# Patient Record
Sex: Female | Born: 1985 | Race: White | Hispanic: No | Marital: Married | Smoking: Never smoker
Health system: Southern US, Community
[De-identification: ages and names within clinical notes are randomized; demographics above are authoritative.]

## PROBLEM LIST (undated history)

## (undated) DIAGNOSIS — Z789 Other specified health status: Secondary | ICD-10-CM

## (undated) HISTORY — PX: WISDOM TOOTH EXTRACTION: SHX21

---

## 2020-01-20 ENCOUNTER — Encounter: Payer: Self-pay | Admitting: Radiology

## 2020-01-25 ENCOUNTER — Other Ambulatory Visit (HOSPITAL_COMMUNITY)
Admission: RE | Admit: 2020-01-25 | Discharge: 2020-01-25 | Disposition: A | Discharge status: Home / Self Care | Payer: No Typology Code available for payment source | Source: Ambulatory Visit | Attending: Family Medicine | Admitting: Family Medicine

## 2020-01-25 ENCOUNTER — Ambulatory Visit (INDEPENDENT_AMBULATORY_CARE_PROVIDER_SITE_OTHER): Payer: No Typology Code available for payment source | Admitting: Obstetrics and Gynecology

## 2020-01-25 ENCOUNTER — Other Ambulatory Visit: Payer: Self-pay

## 2020-01-25 ENCOUNTER — Encounter: Payer: Self-pay | Admitting: Obstetrics and Gynecology

## 2020-01-25 ENCOUNTER — Encounter: Payer: Self-pay | Admitting: Family Medicine

## 2020-01-25 VITALS — BP 126/78 | HR 103 | Ht 65.5 in | Wt 140.0 lb

## 2020-01-25 DIAGNOSIS — Z3480 Encounter for supervision of other normal pregnancy, unspecified trimester: Secondary | ICD-10-CM

## 2020-01-25 DIAGNOSIS — N96 Recurrent pregnancy loss: Secondary | ICD-10-CM

## 2020-01-25 DIAGNOSIS — Z3A12 12 weeks gestation of pregnancy: Secondary | ICD-10-CM

## 2020-01-25 DIAGNOSIS — Z3481 Encounter for supervision of other normal pregnancy, first trimester: Secondary | ICD-10-CM

## 2020-01-25 NOTE — Progress Notes (Signed)
DATING AND VIABILITY SONOGRAM   Krista Russell is a 34 y.o. year old [phone] with LMP Patient's last menstrual period was 10/31/2019. which would correlate to  [redacted]w[redacted]d weeks gestation.  She has regular menstrual cycles.   She is here today for a confirmatory initial sonogram.    GESTATION: SINGLETON yes     FETAL ACTIVITY:          Heart rate     166          The fetus is active.   GESTATIONAL AGE AND  BIOMETRICS:  Gestational criteria: Estimated Date of Delivery: 08/06/20 by LMP now at [redacted]w[redacted]d  Previous Scans:0  GESTATIONAL SAC            mm          weeks  CROWN RUMP LENGTH           5.27 mm         12.0 weeks                                                   AVERAGE EGA(BY THIS SCAN):  12.0 weeks  WORKING EDD( LMP ):  08/06/2020     TECHNICIAN COMMENTS:   Patient informed that the ultrasound is considered a limited obstetric ultrasound and is not intended to be a complete ultrasound exam. Patient also informed that the ultrasound is not being completed with the intent of assessing for fetal or placental anomalies or any pelvic abnormalities. Explained that the purpose of today's ultrasound is to assess for fetal heart rate. Patient acknowledges the purpose of the exam and the limitations of the study.      A copy of this report including all images has been saved and backed up to a second source for retrieval if needed. All measures and details of the anatomical scan, placentation, fluid volume and pelvic anatomy are contained in that report.  Scheryl Marten 01/25/2020 2:42 PM

## 2020-01-25 NOTE — Progress Notes (Signed)
New OB Note  01/25/2020   Clinic: [facility] for [facility]  Chief Complaint: NOB  Transfer of Care Patient: no  History of Present Illness: Ms. Krista Russell is a 34 y.o. [id number] @ 12/2 weeks (EDC 3/7, based on Patient's last menstrual period was 10/31/2019.=12wk u/s.  Preg complicated by has Supervision of other normal pregnancy, antepartum and Recurrent pregnancy loss on their problem list.   Any events prior to today's visit: no Her periods were: qmonth, regular She was using no method when she conceived.  She has Negative signs or symptoms of nausea/vomiting of pregnancy. She has Negative signs or symptoms of miscarriage or preterm labor  ROS: A 12-point review of systems was performed and negative, except as stated in the above HPI.  OBGYN History: As per HPI. OB History  Gravida Para Term Preterm AB Living  6 1 1   4 1   SAB TAB Ectopic Multiple Live Births  4       1    # Outcome Date GA Lbr Len/2nd Weight Sex Delivery Anes PTL Lv  6 Current           5 SAB 05/2019          4 SAB 03/2019          3 SAB 10/2018          2 Term 02/09/16    F Vag-Forceps   LIV  1 SAB 2016            Obstetric Comments  All very early SABs that passed spontaneously.    Prior children are healthy, doing well, and without any problems or issues: yes History of pap smears: Yes. Last pap smear 2017 and results were NILM/HPV negative   Past Medical History: History reviewed. No pertinent past medical history.  Past Surgical History: Past Surgical History:  Procedure Laterality Date  . WISDOM TOOTH EXTRACTION      Family History:  History reviewed. No pertinent family history.  Social History:  Social History   Socioeconomic History  . Marital status: Married    Spouse name: Not on file  . Number of children: Not on file  . Years of education: Not on file  . Highest education level: Not on file  Occupational History  . Not on file  Tobacco Use  . Smoking status:  Never Smoker  . Smokeless tobacco: Never Used  Vaping Use  . Vaping Use: Never used  Substance and Sexual Activity  . Alcohol use: Not Currently  . Drug use: Not Currently  . Sexual activity: Yes    Birth control/protection: None  Other Topics Concern  . Not on file  Social History Narrative  . Not on file   Social Determinants of Health   Financial Resource Strain:   . Difficulty of Paying Living Expenses: Not on file  Food Insecurity:   . Worried About 2018 in the Last Year: Not on file  . Ran Out of Food in the Last Year: Not on file  Transportation Needs:   . Lack of Transportation (Medical): Not on file  . Lack of Transportation (Non-Medical): Not on file  Physical Activity:   . Days of Exercise per Week: Not on file  . Minutes of Exercise per Session: Not on file  Stress:   . Feeling of Stress : Not on file  Social Connections:   . Frequency of Communication with Friends and Family: Not on file  . Frequency of Social  Gatherings with Friends and Family: Not on file  . Attends Religious Services: Not on file  . Active Member of Clubs or Organizations: Not on file  . Attends Banker Meetings: Not on file  . Marital Status: Not on file  Intimate Partner Violence:   . Fear of Current or Ex-Partner: Not on file  . Emotionally Abused: Not on file  . Physically Abused: Not on file  . Sexually Abused: Not on file  Patient is a PA   Allergy: No Known Allergies  Current Outpatient Medications: Prenatal vitamin  Physical Exam:   BP 126/78   Pulse (!) 103   Ht 5' 5.5" (1.664 m)   Wt 140 lb (63.5 kg)   LMP 10/31/2019   BMI 22.94 kg/m  Body mass index is 22.94 kg/m. Contractions: Not present Vag. Bleeding: None. Fundal height: not applicable FHTs: 160s  General appearance: Well nourished, well developed female in no acute distress. Exam declined Respiratory:   Normal respiratory effort Neuro/Psych:  Normal mood and affect.  Skin:   Warm and dry.   Laboratory: None  Imaging:  Bedside u/s: SLIUP at 12/0 by CRL, FHR 160s, subjectively normal fluid.   Assessment: pt doing well  Plan: 1. Supervision of other normal pregnancy, antepartum Forceps delivery in the past due to what sounds like prolonged 2nd stage with mediolateral episiotomy. No sequelae.   Routine care. Declined genetics. Offer afp nv.  - CBC/D/Plt+RPR+Rh+ABO+Rub Ab... - Culture, OB Urine - GC/Chlamydia probe amp ([place])not at [facility] - VITAMIN D 25 Hydroxy (Vit-D Deficiency, Fractures) - [place] MFM OB COMP + 14 WK; Future  2. Recurrent pregnancy loss All early SABs that passed spontaneously. Husband in mid to late 30s. Per patient, she had lab work (APLA, ACM, etc) with prior OBGYN and negative.   Problem list reviewed and updated.  Follow up in 3 weeks.   >50% of 30 min visit spent on counseling and coordination of care.     Cornelia Copa MD Attending Center for [facility])

## 2020-01-25 NOTE — Patient Instructions (Signed)
March 7th is your Due Date Today you are 12 weeks and 2 days.

## 2020-01-26 LAB — CBC/D/PLT+RPR+RH+ABO+RUB AB...
Antibody Screen: NEGATIVE
Basophils Absolute: 0 10*3/uL (ref 0.0–0.2)
Basos: 0 %
EOS (ABSOLUTE): 0.1 10*3/uL (ref 0.0–0.4)
Eos: 1 %
HCV Ab: 0.1 s/co ratio (ref 0.0–0.9)
HIV Screen 4th Generation wRfx: NONREACTIVE
Hematocrit: 41.9 % (ref 34.0–46.6)
Hemoglobin: 13.6 g/dL (ref 11.1–15.9)
Hepatitis B Surface Ag: NEGATIVE
Immature Grans (Abs): 0.1 10*3/uL (ref 0.0–0.1)
Immature Granulocytes: 1 %
Lymphocytes Absolute: 1.9 10*3/uL (ref 0.7–3.1)
Lymphs: 18 %
MCH: 29.9 pg (ref 26.6–33.0)
MCHC: 32.5 g/dL (ref 31.5–35.7)
MCV: 92 fL (ref 79–97)
Monocytes Absolute: 0.7 10*3/uL (ref 0.1–0.9)
Monocytes: 7 %
Neutrophils Absolute: 7.6 10*3/uL — ABNORMAL HIGH (ref 1.4–7.0)
Neutrophils: 73 %
Platelets: 324 10*3/uL (ref 150–450)
RBC: 4.55 x10E6/uL (ref 3.77–5.28)
RDW: 13.1 % (ref 11.7–15.4)
RPR Ser Ql: NONREACTIVE
Rh Factor: POSITIVE
Rubella Antibodies, IGG: 2.21 index (ref >0.99)
WBC: 10.3 10*3/uL (ref 3.4–10.8)

## 2020-01-26 LAB — VITAMIN D 25 HYDROXY (VIT D DEFICIENCY, FRACTURES): Vit D, 25-Hydroxy: 21 ng/mL — ABNORMAL LOW (ref 30.0–100.0)

## 2020-01-26 LAB — HCV INTERPRETATION

## 2020-01-27 ENCOUNTER — Encounter: Payer: Self-pay | Admitting: Obstetrics and Gynecology

## 2020-01-27 DIAGNOSIS — R7989 Other specified abnormal findings of blood chemistry: Secondary | ICD-10-CM | POA: Insufficient documentation

## 2020-01-27 LAB — GC/CHLAMYDIA PROBE AMP (~~LOC~~) NOT AT ARMC
Chlamydia: NEGATIVE
Comment: NEGATIVE
Comment: NORMAL
Neisseria Gonorrhea: NEGATIVE

## 2020-01-28 LAB — URINE CULTURE, OB REFLEX

## 2020-01-28 LAB — CULTURE, OB URINE

## 2020-01-30 ENCOUNTER — Encounter: Payer: Self-pay | Admitting: Radiology

## 2020-02-21 ENCOUNTER — Ambulatory Visit (INDEPENDENT_AMBULATORY_CARE_PROVIDER_SITE_OTHER): Payer: No Typology Code available for payment source | Admitting: Family Medicine

## 2020-02-21 ENCOUNTER — Other Ambulatory Visit: Payer: Self-pay

## 2020-02-21 DIAGNOSIS — Z3480 Encounter for supervision of other normal pregnancy, unspecified trimester: Secondary | ICD-10-CM

## 2020-02-21 NOTE — Patient Instructions (Signed)

## 2020-02-21 NOTE — Progress Notes (Signed)
   PRENATAL VISIT NOTE  Subjective:  Krista Russell is a 34 y.o. [phone] at [redacted]w[redacted]d being seen today for ongoing prenatal care.  She is currently monitored for the following issues for this low-risk pregnancy and has Supervision of other normal pregnancy, antepartum; Recurrent pregnancy loss; and Low vitamin D level on their problem list.  Patient reports no complaints.  Contractions: Not present. Vag. Bleeding: None.   . Denies leaking of fluid.   The following portions of the patient's history were reviewed and updated as appropriate: allergies, current medications, past family history, past medical history, past social history, past surgical history and problem list.   Objective:   Vitals:   02/21/20 1054  BP: 121/83  Pulse: (!) 106  Weight: 141 lb (64 kg)    Fetal Status: Fetal Heart Rate (bpm): 165         General:  Alert, oriented and cooperative. Patient is in no acute distress.  Skin: Skin is warm and dry. No rash noted.   Cardiovascular: Normal heart rate noted  Respiratory: Normal respiratory effort, no problems with respiration noted  Abdomen: Soft, gravid, appropriate for gestational age.  Pain/Pressure: Absent     Pelvic: Cervical exam deferred        Extremities: Normal range of motion.  Edema: None  Mental Status: Normal mood and affect. Normal behavior. Normal judgment and thought content.   Assessment and Plan:  Pregnancy: [id number] at [redacted]w[redacted]d 1. Supervision of other normal pregnancy, antepartum Continue routine prenatal care. Declined AFP  Preterm labor symptoms and general obstetric precautions including but not limited to vaginal bleeding, contractions, leaking of fluid and fetal movement were reviewed in detail with the patient. Please refer to After Visit Summary for other counseling recommendations.   Return in 4 weeks (on 03/20/2020).  Future Appointments  Date Time Provider Department Center  03/08/2020  9:00 AM [facility]  03/20/2020  10:15 AM Reva Bores, MD [facility]    Reva Bores, MD

## 2020-03-08 ENCOUNTER — Ambulatory Visit: Payer: No Typology Code available for payment source | Attending: Obstetrics and Gynecology

## 2020-03-08 ENCOUNTER — Other Ambulatory Visit: Payer: Self-pay | Admitting: Obstetrics and Gynecology

## 2020-03-08 ENCOUNTER — Other Ambulatory Visit: Payer: Self-pay

## 2020-03-08 DIAGNOSIS — O358XX0 Maternal care for other (suspected) fetal abnormality and damage, not applicable or unspecified: Secondary | ICD-10-CM

## 2020-03-08 DIAGNOSIS — Z3480 Encounter for supervision of other normal pregnancy, unspecified trimester: Secondary | ICD-10-CM | POA: Diagnosis present

## 2020-03-08 DIAGNOSIS — Z3A18 18 weeks gestation of pregnancy: Secondary | ICD-10-CM

## 2020-03-12 ENCOUNTER — Encounter: Payer: Self-pay | Admitting: Obstetrics and Gynecology

## 2020-03-12 DIAGNOSIS — O283 Abnormal ultrasonic finding on antenatal screening of mother: Secondary | ICD-10-CM | POA: Insufficient documentation

## 2020-03-20 ENCOUNTER — Other Ambulatory Visit: Payer: Self-pay

## 2020-03-20 ENCOUNTER — Ambulatory Visit (INDEPENDENT_AMBULATORY_CARE_PROVIDER_SITE_OTHER): Payer: No Typology Code available for payment source | Admitting: Family Medicine

## 2020-03-20 VITALS — BP 121/84 | HR 103 | Wt 145.0 lb

## 2020-03-20 DIAGNOSIS — O283 Abnormal ultrasonic finding on antenatal screening of mother: Secondary | ICD-10-CM

## 2020-03-20 DIAGNOSIS — Z3480 Encounter for supervision of other normal pregnancy, unspecified trimester: Secondary | ICD-10-CM

## 2020-03-20 NOTE — Patient Instructions (Signed)

## 2020-03-20 NOTE — Progress Notes (Signed)
   PRENATAL VISIT NOTE  Subjective:  Krista Russell is a 34 y.o. [phone] at [redacted]w[redacted]d being seen today for ongoing prenatal care.  She is currently monitored for the following issues for this low-risk pregnancy and has Supervision of other normal pregnancy, antepartum; Recurrent pregnancy loss; Low vitamin D level; and Echogenic intracardiac focus of fetus on prenatal ultrasound on their problem list.  Patient reports no complaints.  Contractions: Not present. Vag. Bleeding: None.  Movement: Present. Denies leaking of fluid.   The following portions of the patient's history were reviewed and updated as appropriate: allergies, current medications, past family history, past medical history, past social history, past surgical history and problem list.   Objective:   Vitals:   03/20/20 1021  BP: 121/84  Pulse: (!) 103  Weight: 145 lb (65.8 kg)    Fetal Status: Fetal Heart Rate (bpm): 160   Movement: Present     General:  Alert, oriented and cooperative. Patient is in no acute distress.  Skin: Skin is warm and dry. No rash noted.   Cardiovascular: Normal heart rate noted  Respiratory: Normal respiratory effort, no problems with respiration noted  Abdomen: Soft, gravid, appropriate for gestational age.  Pain/Pressure: Present     Pelvic: Cervical exam deferred        Extremities: Normal range of motion.     Mental Status: Normal mood and affect. Normal behavior. Normal judgment and thought content.   Assessment and Plan:  Pregnancy: [id number] at [redacted]w[redacted]d 1. Supervision of other normal pregnancy, antepartum Aanatomy revealed EIF--reviewed risks for NIPT - Genetic Screening  2. Echogenic intracardiac focus of fetus on prenatal ultrasound   General obstetric precautions including but not limited to vaginal bleeding, contractions, leaking of fluid and fetal movement were reviewed in detail with the patient. Please refer to After Visit Summary for other counseling recommendations.   Return in 4  weeks (on 04/17/2020) for in person.  Future Appointments  Date Time Provider Department Center  04/25/2020  9:30 AM Calvert Cantor, CNM [facility]    Reva Bores, MD

## 2020-03-26 ENCOUNTER — Telehealth: Payer: Self-pay | Admitting: Radiology

## 2020-03-26 ENCOUNTER — Encounter: Payer: Self-pay | Admitting: Radiology

## 2020-03-26 NOTE — Telephone Encounter (Signed)
Patient was informed of Panorama results including fetal sex 

## 2020-04-25 ENCOUNTER — Other Ambulatory Visit: Payer: Self-pay

## 2020-04-25 ENCOUNTER — Ambulatory Visit (INDEPENDENT_AMBULATORY_CARE_PROVIDER_SITE_OTHER): Payer: No Typology Code available for payment source | Admitting: Advanced Practice Midwife

## 2020-04-25 VITALS — BP 117/79 | HR 114 | Wt 153.0 lb

## 2020-04-25 DIAGNOSIS — Z3480 Encounter for supervision of other normal pregnancy, unspecified trimester: Secondary | ICD-10-CM

## 2020-04-25 DIAGNOSIS — O283 Abnormal ultrasonic finding on antenatal screening of mother: Secondary | ICD-10-CM

## 2020-04-25 NOTE — Progress Notes (Signed)
   PRENATAL VISIT NOTE  Subjective:  Krista Russell is a 34 y.o. [phone] at [redacted]w[redacted]d being seen today for ongoing prenatal care.  She is currently monitored for the following issues for this low-risk pregnancy and has Supervision of other normal pregnancy, antepartum; Recurrent pregnancy loss; Low vitamin D level; and Echogenic intracardiac focus of fetus on prenatal ultrasound on their problem list.  Patient reports no complaints. Excited to be feeling so well.   Contractions: Not present. Vag. Bleeding: None.  Movement: Present. Denies leaking of fluid.   The following portions of the patient's history were reviewed and updated as appropriate: allergies, current medications, past family history, past medical history, past social history, past surgical history and problem list. Problem list updated.  Objective:   Vitals:   04/25/20 0932  BP: 117/79  Pulse: (!) 114  Weight: 153 lb (69.4 kg)    Fetal Status: Fetal Heart Rate (bpm): 148 Fundal Height: 25 cm Movement: Present     General:  Alert, oriented and cooperative. Patient is in no acute distress.  Skin: Skin is warm and dry. No rash noted.   Cardiovascular: Normal heart rate noted  Respiratory: Normal respiratory effort, no problems with respiration noted  Abdomen: Soft, gravid, appropriate for gestational age.  Pain/Pressure: Absent     Pelvic: Cervical exam deferred        Extremities: Normal range of motion.  Edema: None  Mental Status: Normal mood and affect. Normal behavior. Normal judgment and thought content.   Assessment and Plan:  Pregnancy: [id number] at [redacted]w[redacted]d  1. Supervision of other normal pregnancy, antepartum - GTT next visit. Discussed with APP team and Dr. Macon Large. Pt may substitute 20 oz Sprite for Glucola PRN  2. Echogenic intracardiac focus of fetus on prenatal ultrasound - Low risk Panorama, no further surveillance indicated at this time  - Declined Amniocentesis during MFM appt 03/08/2020  Preterm labor  symptoms and general obstetric precautions including but not limited to vaginal bleeding, contractions, leaking of fluid and fetal movement were reviewed in detail with the patient. Please refer to After Visit Summary for other counseling recommendations.    Future Appointments  Date Time Provider Department Center  05/23/2020  8:10 AM Calvert Cantor, CNM [facility]    Calvert Cantor, [place]

## 2020-05-23 ENCOUNTER — Other Ambulatory Visit: Payer: Self-pay

## 2020-05-23 ENCOUNTER — Encounter: Payer: Self-pay | Admitting: Advanced Practice Midwife

## 2020-05-23 ENCOUNTER — Ambulatory Visit (INDEPENDENT_AMBULATORY_CARE_PROVIDER_SITE_OTHER): Payer: No Typology Code available for payment source | Admitting: Advanced Practice Midwife

## 2020-05-23 VITALS — BP 111/74 | HR 105 | Wt 153.0 lb

## 2020-05-23 DIAGNOSIS — O09293 Supervision of pregnancy with other poor reproductive or obstetric history, third trimester: Secondary | ICD-10-CM | POA: Insufficient documentation

## 2020-05-23 DIAGNOSIS — Z3480 Encounter for supervision of other normal pregnancy, unspecified trimester: Secondary | ICD-10-CM

## 2020-05-23 DIAGNOSIS — Z3A29 29 weeks gestation of pregnancy: Secondary | ICD-10-CM

## 2020-05-23 NOTE — Progress Notes (Signed)
   PRENATAL VISIT NOTE  Subjective:  Krista Russell is a 34 y.o. [phone] at [redacted]w[redacted]d being seen today for ongoing prenatal care.  She is currently monitored for the following issues for this low-risk pregnancy and has Supervision of other normal pregnancy, antepartum; Recurrent pregnancy loss; Low vitamin D level; Echogenic intracardiac focus of fetus on prenatal ultrasound; and Hx of forceps delivery in prior pregnancy, currently pregnant, third trimester on their problem list.  Patient reports no complaints. Contractions: Not present. Vag. Bleeding: None.  Movement: Present. Denies leaking of fluid.   The following portions of the patient's history were reviewed and updated as appropriate: allergies, current medications, past family history, past medical history, past social history, past surgical history and problem list. Problem list updated.  Objective:   Vitals:   05/23/20 0824  BP: 111/74  Pulse: (!) 105  Weight: 153 lb (69.4 kg)    Fetal Status: Fetal Heart Rate (bpm): 147 Fundal Height: 28 cm Movement: Present     General:  Alert, oriented and cooperative. Patient is in no acute distress.  Skin: Skin is warm and dry. No rash noted.   Cardiovascular: Normal heart rate noted  Respiratory: Normal respiratory effort, no problems with respiration noted  Abdomen: Soft, gravid, appropriate for gestational age.  Pain/Pressure: Absent     Pelvic: Cervical exam deferred        Extremities: Normal range of motion.  Edema: None  Mental Status: Normal mood and affect. Normal behavior. Normal judgment and thought content.   Assessment and Plan:  Pregnancy: [id number] at [redacted]w[redacted]d  1. Supervision of other normal pregnancy, antepartum - LOB, routine care - Glucose tolerance, 1 hour - CBC  2. Hx Forceps -assisted Vaginal Delivery - Patient states baby was asynclitic, she pushed for > 4 hours, was advised to have forceps with experienced Provider - Discussed interventions in labor for improved  fetal positioning, typical ongoing assessment of progress with pushing, presence of very skilled nursing staff to provide support and repositioning with or without epidural - Advised consideration off doula for labor support. Given list  Preterm labor symptoms and general obstetric precautions including but not limited to vaginal bleeding, contractions, leaking of fluid and fetal movement were reviewed in detail with the patient. Please refer to After Visit Summary for other counseling recommendations.   Return in about 2 weeks (around 06/06/2020).  Future Appointments  Date Time Provider Department Center  06/06/2020 10:00 AM Calvert Cantor, [place] [facility]  06/20/2020  8:30 AM Calvert Cantor, CNM [facility]   Calvert Cantor, [place]

## 2020-05-23 NOTE — Patient Instructions (Addendum)
Third Trimester of Pregnancy  The third trimester is from week 28 through week 40 (months 7 through 9). This trimester is when your unborn baby (fetus) is growing very fast. At the end of the ninth month, the unborn baby is about 20 inches in length. It weighs about 6-10 pounds. Follow these instructions at home: Medicines  Take over-the-counter and prescription medicines only as told by your doctor. Some medicines are safe and some medicines are not safe during pregnancy.  Take a prenatal vitamin that contains at least 600 micrograms (mcg) of folic acid.  If you have trouble pooping (constipation), take medicine that will make your stool soft (stool softener) if your doctor approves. Eating and drinking   Eat regular, healthy meals.  Avoid raw meat and uncooked cheese.  If you get low calcium from the food you eat, talk to your doctor about taking a daily calcium supplement.  Eat four or five small meals rather than three large meals a day.  Avoid foods that are high in fat and sugars, such as fried and sweet foods.  To prevent constipation: ? Eat foods that are high in fiber, like fresh fruits and vegetables, whole grains, and beans. ? Drink enough fluids to keep your pee (urine) clear or pale yellow. Activity  Exercise only as told by your doctor. Stop exercising if you start to have cramps.  Avoid heavy lifting, wear low heels, and sit up straight.  Do not exercise if it is too hot, too humid, or if you are in a place of great height (high altitude).  You may continue to have sex unless your doctor tells you not to. Relieving pain and discomfort  Wear a good support bra if your breasts are tender.  Take frequent breaks and rest with your legs raised if you have leg cramps or low back pain.  Take warm water baths (sitz baths) to soothe pain or discomfort caused by hemorrhoids. Use hemorrhoid cream if your doctor approves.  If you develop puffy, bulging veins (varicose  veins) in your legs: ? Wear support hose or compression stockings as told by your doctor. ? Raise (elevate) your feet for 15 minutes, 3-4 times a day. ? Limit salt in your food. Safety  Wear your seat belt when driving.  Make a list of emergency phone numbers, including numbers for family, friends, the hospital, and police and fire departments. Preparing for your baby's arrival To prepare for the arrival of your baby:  Take prenatal classes.  Practice driving to the hospital.  Visit the hospital and tour the maternity area.  Talk to your work about taking leave once the baby comes.  Pack your hospital bag.  Prepare the baby's room.  Go to your doctor visits.  Buy a rear-facing car seat. Learn how to install it in your car. General instructions  Do not use hot tubs, steam rooms, or saunas.  Do not use any products that contain nicotine or tobacco, such as cigarettes and e-cigarettes. If you need help quitting, ask your doctor.  Do not drink alcohol.  Do not douche or use tampons or scented sanitary pads.  Do not cross your legs for long periods of time.  Do not travel for long distances unless you must. Only do so if your doctor says it is okay.  Visit your dentist if you have not gone during your pregnancy. Use a soft toothbrush to brush your teeth. Be gentle when you floss.  Avoid cat litter boxes and soil   used by cats. These carry germs that can cause birth defects in the baby and can cause a loss of your baby (miscarriage) or stillbirth.  Keep all your prenatal visits as told by your doctor. This is important. Contact a doctor if:  You are not sure if you are in labor or if your water has broken.  You are dizzy.  You have mild cramps or pressure in your lower belly.  You have a nagging pain in your belly area.  You continue to feel sick to your stomach, you throw up, or you have watery poop.  You have bad smelling fluid coming from your vagina.  You have  pain when you pee. Get help right away if:  You have a fever.  You are leaking fluid from your vagina.  You are spotting or bleeding from your vagina.  You have severe belly cramps or pain.  You lose or gain weight quickly.  You have trouble catching your breath and have chest pain.  You notice sudden or extreme puffiness (swelling) of your face, hands, ankles, feet, or legs.  You have not felt the baby move in over an hour.  You have severe headaches that do not go away with medicine.  You have trouble seeing.  You are leaking, or you are having a gush of fluid, from your vagina before you are 37 weeks.  You have regular belly spasms (contractions) before you are 37 weeks. Summary  The third trimester is from week 28 through week 40 (months 7 through 9). This time is when your unborn baby is growing very fast.  Follow your doctor's advice about medicine, food, and activity.  Get ready for the arrival of your baby by taking prenatal classes, getting all the baby items ready, preparing the baby's room, and visiting your doctor to be checked.  Get help right away if you are bleeding from your vagina, or you have chest pain and trouble catching your breath, or if you have not felt your baby move in over an hour. This information is not intended to replace advice given to you by your health care provider. Make sure you discuss any questions you have with your health care provider. Document Revised: 09/09/2018 Document Reviewed: 06/24/2016 Elsevier Patient Education  2020 [employer].  DOULA LIST   [facility] [employer] Doula  [place]  [phone]  [email or web address]  [email or web address]  [place] the [employer] [phone]  [email or web address]   [employer]   Precious Danford Bad   [email or web address]   ??THE MOTHERLY DOULA?? Zola Button   [phone]   [email or web address]     The  [facility]und[facility] [employer] Doula  Olive Bass  [phone]    [email or web address]   [employer] [facility]  Angie Rosier     [phone]     [email or web address]   Renato Gails: Doula & Photographer   Renato Gails [phone]       [email or web address]  [email or web address]   [employer] Doula [facility]  [place] [phone]   [email or web address]   [place], [place]  Lolita Rieger  [phone]  [email or web address]   [email or web address]   [facility]   Kizzie Furnish   [phone]  [email or web address]   Dina Rich [place] Doula  Dina Rich  [phone] [email or web address]  Natural [facility] Doulas  Cornelious Bryant         [facility]       Versie Starks  [phone] [email or web address]  [email or web address]   [facility]   [place] [phone] [email or web address]   [facility] Doula Services  Camelia Eng     [phone]  [email or web address]  [facility]     [phone]  [email or web address]  Facebook and IG [email or web address] .  [phone] [email or web address] .[phone] [email or web address]   [phone]  [phone] [email or web address]     [facility]  [phone] [email or web address]   IG [email or web address]    [phone]    [phone] [email or web address]   Lurline Hare "[employer]" Free  [phone] [email or web address]    Mtende Roll  [phone] [email or web address]   Susie Williams   [email or web address]    [phone]    [phone] [email or web address]     Denita Lung  [phone] [email or web address] .[email or web address] Galloway  [phone] [email or web address]    [place]    [phone]   [facility]  [place]  [phone] [email or web address]  Doula Mama [phone] [email or web address] [email or web address]  [facility] Maryjean Ka  [phone] [email or web address]  [facility] [phone]  [email or web address]  [email or web address]  [phone] [email or web address] .[email or web address]   Allen Kell [phone] [email or web address] .Ardine Eng   [phone] [email or web address]   Gilda Crease  [phone] [email or web address]  [employer]  Jonelle Sports  [phone] [email or web address]   Jackelyn Poling [phone] [email or web address]  MOOR Divine Myeka Dunn  [email or web address]   Cristina Gong [phone] [email or web address]   Glory Buff [phone] [email or web address]   Whitney Muse [phone] [email or web address]

## 2020-05-24 LAB — GLUCOSE TOLERANCE, 1 HOUR: Glucose, 1Hr PP: 151 mg/dL (ref 65–199)

## 2020-05-24 LAB — CBC
Hematocrit: 31.9 % — ABNORMAL LOW (ref 34.0–46.6)
Hemoglobin: 10.8 g/dL — ABNORMAL LOW (ref 11.1–15.9)
MCH: 29.8 pg (ref 26.6–33.0)
MCHC: 33.9 g/dL (ref 31.5–35.7)
MCV: 88 fL (ref 79–97)
Platelets: 262 10*3/uL (ref 150–450)
RBC: 3.62 x10E6/uL — ABNORMAL LOW (ref 3.77–5.28)
RDW: 12 % (ref 11.7–15.4)
WBC: 10.2 10*3/uL (ref 3.4–10.8)

## 2020-06-06 ENCOUNTER — Ambulatory Visit (INDEPENDENT_AMBULATORY_CARE_PROVIDER_SITE_OTHER): Payer: No Typology Code available for payment source | Admitting: Advanced Practice Midwife

## 2020-06-06 ENCOUNTER — Other Ambulatory Visit: Payer: Self-pay

## 2020-06-06 VITALS — BP 108/76 | HR 118 | Wt 154.0 lb

## 2020-06-06 DIAGNOSIS — Z3480 Encounter for supervision of other normal pregnancy, unspecified trimester: Secondary | ICD-10-CM

## 2020-06-06 DIAGNOSIS — Z3A31 31 weeks gestation of pregnancy: Secondary | ICD-10-CM

## 2020-06-06 DIAGNOSIS — O283 Abnormal ultrasonic finding on antenatal screening of mother: Secondary | ICD-10-CM

## 2020-06-06 NOTE — Patient Instructions (Signed)
Fetal Movement Counts Patient Name: ________________________________________________ Patient Due Date: ____________________ What is a fetal movement count?  A fetal movement count is the number of times that you feel your baby move during a certain amount of time. This may also be called a fetal kick count. A fetal movement count is recommended for every pregnant woman. You may be asked to start counting fetal movements as early as week 28 of your pregnancy. Pay attention to when your baby is most active. You may notice your baby's sleep and wake cycles. You may also notice things that make your baby move more. You should do a fetal movement count:  When your baby is normally most active.  At the same time each day. A good time to count movements is while you are resting, after having something to eat and drink. How do I count fetal movements? 1. Find a quiet, comfortable area. Sit, or lie down on your side. 2. Write down the date, the start time and stop time, and the number of movements that you felt between those two times. Take this information with you to your health care visits. 3. Write down your start time when you feel the first movement. 4. Count kicks, flutters, swishes, rolls, and jabs. You should feel at least 10 movements. 5. You may stop counting after you have felt 10 movements, or if you have been counting for 2 hours. Write down the stop time. 6. If you do not feel 10 movements in 2 hours, contact your health care provider for further instructions. Your health care provider may want to do additional tests to assess your baby's well-being. Contact a health care provider if:  You feel fewer than 10 movements in 2 hours.  Your baby is not moving like he or she usually does. Date: ____________ Start time: ____________ Stop time: ____________ Movements: ____________ Date: ____________ Start time: ____________ Stop time: ____________ Movements: ____________ Date: ____________  Start time: ____________ Stop time: ____________ Movements: ____________ Date: ____________ Start time: ____________ Stop time: ____________ Movements: ____________ Date: ____________ Start time: ____________ Stop time: ____________ Movements: ____________ Date: ____________ Start time: ____________ Stop time: ____________ Movements: ____________ Date: ____________ Start time: ____________ Stop time: ____________ Movements: ____________ Date: ____________ Start time: ____________ Stop time: ____________ Movements: ____________ Date: ____________ Start time: ____________ Stop time: ____________ Movements: ____________ This information is not intended to replace advice given to you by your health care provider. Make sure you discuss any questions you have with your health care provider. Document Revised: 01/06/2019 Document Reviewed: 01/06/2019 Elsevier Patient Education  2020 Elsevier Inc.  

## 2020-06-06 NOTE — Progress Notes (Signed)
   PRENATAL VISIT NOTE  Subjective:  Krista Russell is a 35 y.o. [phone] at [redacted]w[redacted]d being seen today for ongoing prenatal care.  She is currently monitored for the following issues for this low-risk pregnancy and has Supervision of other normal pregnancy, antepartum; Recurrent pregnancy loss; Low vitamin D level; Echogenic intracardiac focus of fetus on prenatal ultrasound; and Hx of forceps delivery in prior pregnancy, currently pregnant, third trimester on their problem list.  Patient reports One episode of Braxton Hicks contractions which resolved with rest.  Contractions: Not present. Vag. Bleeding: None.  Movement: Present. Denies leaking of fluid.   The following portions of the patient's history were reviewed and updated as appropriate: allergies, current medications, past family history, past medical history, past social history, past surgical history and problem list. Problem list updated.  Objective:   Vitals:   06/06/20 1014  BP: 108/76  Pulse: (!) 118  Weight: 154 lb (69.9 kg)    Fetal Status: Fetal Heart Rate (bpm): 138 Fundal Height: 30 cm Movement: Present     General:  Alert, oriented and cooperative. Patient is in no acute distress.  Skin: Skin is warm and dry. No rash noted.   Cardiovascular: Normal heart rate noted  Respiratory: Normal respiratory effort, no problems with respiration noted  Abdomen: Soft, gravid, appropriate for gestational age.  Pain/Pressure: Absent     Pelvic: Cervical exam deferred        Extremities: Normal range of motion.  Edema: None  Mental Status: Normal mood and affect. Normal behavior. Normal judgment and thought content.   Assessment and Plan:  Pregnancy: [id number] at [redacted]w[redacted]d  1. Supervision of other normal pregnancy, antepartum - LOB - Advised daily kick counts, interventions for low kick number, indication for evaluation in MAU  2. [redacted] weeks gestation of pregnancy   3. Echogenic intracardiac focus of fetus on prenatal ultrasound - Low  risk NIPS  Preterm labor symptoms and general obstetric precautions including but not limited to vaginal bleeding, contractions, leaking of fluid and fetal movement were reviewed in detail with the patient. Please refer to After Visit Summary for other counseling recommendations.  Return in about 2 weeks (around 06/20/2020) for For Sam.  Future Appointments  Date Time Provider Department Center  06/20/2020  8:30 AM Calvert Cantor, CNM [facility]    Calvert Cantor, [place]

## 2020-06-14 ENCOUNTER — Telehealth: Payer: Self-pay | Admitting: Radiology

## 2020-06-14 NOTE — Telephone Encounter (Signed)
Left message about changing 06/20/20 appointment to virtual due to COVID increase and confirm that patient has a BP cuff.

## 2020-06-20 ENCOUNTER — Other Ambulatory Visit: Payer: Self-pay

## 2020-06-20 ENCOUNTER — Telehealth (INDEPENDENT_AMBULATORY_CARE_PROVIDER_SITE_OTHER): Payer: No Typology Code available for payment source | Admitting: Advanced Practice Midwife

## 2020-06-20 DIAGNOSIS — Z3A33 33 weeks gestation of pregnancy: Secondary | ICD-10-CM

## 2020-06-20 DIAGNOSIS — O2623 Pregnancy care for patient with recurrent pregnancy loss, third trimester: Secondary | ICD-10-CM

## 2020-06-20 DIAGNOSIS — Z3480 Encounter for supervision of other normal pregnancy, unspecified trimester: Secondary | ICD-10-CM

## 2020-06-20 DIAGNOSIS — O358XX0 Maternal care for other (suspected) fetal abnormality and damage, not applicable or unspecified: Secondary | ICD-10-CM

## 2020-06-20 DIAGNOSIS — Z8759 Personal history of other complications of pregnancy, childbirth and the puerperium: Secondary | ICD-10-CM

## 2020-06-20 NOTE — Patient Instructions (Signed)

## 2020-06-20 NOTE — Progress Notes (Signed)
I connected with  Krista Russell on 06/20/20 at  8:30 AM EST by telephone and verified that I am speaking with the correct person using two identifiers.   I discussed the limitations, risks, security and privacy concerns of performing an evaluation and management service by telephone and the availability of in person appointments. I also discussed with the patient that there may be a patient responsible charge related to this service. The patient expressed understanding and agreed to proceed.  Scheryl Marten, RN 06/20/2020  8:30 AM

## 2020-06-20 NOTE — Progress Notes (Signed)
   OBSTETRICS PRENATAL VIRTUAL VISIT ENCOUNTER NOTE  Provider location: Center for [facility]   I connected with Krista Russell on 06/20/20 at  8:30 AM EST by MyChart Video Encounter at home and verified that I am speaking with the correct person using two identifiers.   I discussed the limitations, risks, security and privacy concerns of performing an evaluation and management service virtually and the availability of in person appointments. I also discussed with the patient that there may be a patient responsible charge related to this service. The patient expressed understanding and agreed to proceed. Subjective:  Krista Russell is a 35 y.o. [phone] at [redacted]w[redacted]d being seen today for ongoing prenatal care.  She is currently monitored for the following issues for this low-risk pregnancy and has Supervision of other normal pregnancy, antepartum; Recurrent pregnancy loss; Low vitamin D level; Echogenic intracardiac focus of fetus on prenatal ultrasound; and Hx of forceps delivery in prior pregnancy, currently pregnant, third trimester on their problem list.  Patient reports no complaints.  Contractions: Not present. Vag. Bleeding: None.  Movement: Present. Denies any leaking of fluid.   The following portions of the patient's history were reviewed and updated as appropriate: allergies, current medications, past family history, past medical history, past social history, past surgical history and problem list.   Objective:  There were no vitals filed for this visit.  Fetal Status:     Movement: Present     General:  Alert, oriented and cooperative. Patient is in no acute distress.  Respiratory: Normal respiratory effort, no problems with respiration noted  Mental Status: Normal mood and affect. Normal behavior. Normal judgment and thought content.  Rest of physical exam deferred due to type of encounter  Imaging: No results found.  Assessment and Plan:  Pregnancy: [id number] at  107w2d 1. Supervision of other normal pregnancy, antepartum - No acute concerns - Preemptive teaching GBS and GCC next visit  2. [redacted] weeks gestation of pregnancy   Preterm labor symptoms and general obstetric precautions including but not limited to vaginal bleeding, contractions, leaking of fluid and fetal movement were reviewed in detail with the patient. I discussed the assessment and treatment plan with the patient. The patient was provided an opportunity to ask questions and all were answered. The patient agreed with the plan and demonstrated an understanding of the instructions. The patient was advised to call back or seek an in-person office evaluation/go to [facility]U at [facility] for any urgent or concerning symptoms. Please refer to After Visit Summary for other counseling recommendations.   I provided ten minutes of face-to-face time during this encounter.  Return in about 3 weeks (around 07/11/2020) for Dr. Alvester Morin please, Sam when able.  Future Appointments  Date Time Provider Department Center  07/11/2020 11:15 AM Federico Flake, MD [facility]  07/18/2020 10:00 AM Reva Bores, MD [facility]    Calvert Cantor, CNM Center for [employer], [facility]

## 2020-07-11 ENCOUNTER — Other Ambulatory Visit: Payer: No Typology Code available for payment source

## 2020-07-11 ENCOUNTER — Other Ambulatory Visit: Payer: Self-pay

## 2020-07-11 ENCOUNTER — Ambulatory Visit (INDEPENDENT_AMBULATORY_CARE_PROVIDER_SITE_OTHER): Payer: No Typology Code available for payment source | Admitting: Family Medicine

## 2020-07-11 VITALS — BP 122/84 | HR 105 | Wt 156.0 lb

## 2020-07-11 DIAGNOSIS — Z3480 Encounter for supervision of other normal pregnancy, unspecified trimester: Secondary | ICD-10-CM

## 2020-07-11 DIAGNOSIS — Z20822 Contact with and (suspected) exposure to covid-19: Secondary | ICD-10-CM

## 2020-07-11 DIAGNOSIS — O321XX9 Maternal care for breech presentation, other fetus: Secondary | ICD-10-CM | POA: Insufficient documentation

## 2020-07-11 DIAGNOSIS — Z3A36 36 weeks gestation of pregnancy: Secondary | ICD-10-CM

## 2020-07-11 DIAGNOSIS — O09293 Supervision of pregnancy with other poor reproductive or obstetric history, third trimester: Secondary | ICD-10-CM

## 2020-07-11 NOTE — Progress Notes (Signed)
   PRENATAL VISIT NOTE  Subjective:  Krista Russell is a 35 y.o. [phone] at [redacted]w[redacted]d being seen today for ongoing prenatal care.  She is currently monitored for the following issues for this low-risk pregnancy and has Supervision of other normal pregnancy, antepartum; Recurrent pregnancy loss; Low vitamin D level; Echogenic intracardiac focus of fetus on prenatal ultrasound; Hx of forceps delivery in prior pregnancy, currently pregnant, third trimester; and Breech presentation on examination, other fetus on their problem list.  Patient reports no complaints.  Contractions: Not present. Vag. Bleeding: None.  Movement: Present. Denies leaking of fluid.   The following portions of the patient's history were reviewed and updated as appropriate: allergies, current medications, past family history, past medical history, past social history, past surgical history and problem list.   Objective:   Vitals:   07/11/20 1121  BP: 122/84  Pulse: (!) 105  Weight: 156 lb (70.8 kg)    Fetal Status: Fetal Heart Rate (bpm): 159 Fundal Height: 36 cm Movement: Present  Presentation: Homero Fellers Breech  General:  Alert, oriented and cooperative. Patient is in no acute distress.  Skin: Skin is warm and dry. No rash noted.   Cardiovascular: Normal heart rate noted  Respiratory: Normal respiratory effort, no problems with respiration noted  Abdomen: Soft, gravid, appropriate for gestational age.  Pain/Pressure: Absent     Pelvic: Cervical exam deferred        Extremities: Normal range of motion.  Edema: None  Mental Status: Normal mood and affect. Normal behavior. Normal judgment and thought content.   Assessment and Plan:  Pregnancy: [id number] at [redacted]w[redacted]d 1. Supervision of other normal pregnancy, antepartum GBS swab only today, declined GC/CT Has back/hip pain and nausea over the weekend with all over bodyaches--- recommended COVID 19 testing. Is now 5 day from onset of sx. Discussed that testing can remain positive for  up to 90d and having a documented COVID test now would help determine her timing of illness and would help her not have to be on COVID precautions at delivery.   2. Hx of forceps delivery in prior pregnancy, currently pregnant, third trimester  3. Breech presentation on examination, other fetus - Counseled about spinning babies, moxibustion, chiropractic care. Patient strongly desires vaginal birth.  - Reviewed ECV at 37-38 weeks. Discussed risks/benefits - Reviewed if still breech recommended CS at 39  4. [redacted] weeks gestation of pregnancy    Preterm labor symptoms and general obstetric precautions including but not limited to vaginal bleeding, contractions, leaking of fluid and fetal movement were reviewed in detail with the patient. Please refer to After Visit Summary for other counseling recommendations.   Return in about 1 week (around 07/18/2020) for Routine prenatal care.  Future Appointments  Date Time Provider Department Center  07/18/2020 10:00 AM Reva Bores, MD [facility]  07/25/2020  2:00 PM Federico Flake, MD [facility]  08/01/2020  8:30 AM Calvert Cantor, CNM [facility]    Federico Flake, MD

## 2020-07-12 LAB — SARS-COV-2, NAA 2 DAY TAT

## 2020-07-12 LAB — NOVEL CORONAVIRUS, NAA: SARS-CoV-2, NAA: DETECTED — AB

## 2020-07-13 ENCOUNTER — Telehealth: Payer: Self-pay

## 2020-07-13 NOTE — Telephone Encounter (Signed)
Called to discuss with patient about COVID-19 symptoms and the use of one of the available treatments for those with mild to moderate Covid symptoms and at a high risk of hospitalization.  Pt appears to qualify for outpatient treatment due to co-morbid conditions and/or a member of an at-risk group in accordance with the FDA Emergency Use Authorization.    Symptom onset: Unknown Vaccinated: Unknown Booster? Unknown Immunocompromised? No per chart Qualifiers: Pregnant  Unable to reach pt - Left message and call back number.  Esther Hardy

## 2020-07-14 LAB — CULTURE, BETA STREP (GROUP B ONLY): Strep Gp B Culture: POSITIVE — AB

## 2020-07-16 ENCOUNTER — Encounter: Payer: Self-pay | Admitting: Family Medicine

## 2020-07-16 DIAGNOSIS — O99820 Streptococcus B carrier state complicating pregnancy: Secondary | ICD-10-CM | POA: Insufficient documentation

## 2020-07-18 ENCOUNTER — Ambulatory Visit (INDEPENDENT_AMBULATORY_CARE_PROVIDER_SITE_OTHER): Payer: No Typology Code available for payment source | Admitting: Family Medicine

## 2020-07-18 ENCOUNTER — Other Ambulatory Visit: Payer: Self-pay

## 2020-07-18 VITALS — BP 130/89 | HR 109 | Wt 158.0 lb

## 2020-07-18 DIAGNOSIS — O09293 Supervision of pregnancy with other poor reproductive or obstetric history, third trimester: Secondary | ICD-10-CM

## 2020-07-18 DIAGNOSIS — O99820 Streptococcus B carrier state complicating pregnancy: Secondary | ICD-10-CM

## 2020-07-18 DIAGNOSIS — Z3480 Encounter for supervision of other normal pregnancy, unspecified trimester: Secondary | ICD-10-CM

## 2020-07-18 DIAGNOSIS — O321XX9 Maternal care for breech presentation, other fetus: Secondary | ICD-10-CM

## 2020-07-18 NOTE — Progress Notes (Signed)
   PRENATAL VISIT NOTE  Subjective:  Krista Russell is a 35 y.o. [phone] at [redacted]w[redacted]d being seen today for ongoing prenatal care.  She is currently monitored for the following issues for this low-risk pregnancy and has Supervision of other normal pregnancy, antepartum; Recurrent pregnancy loss; Low vitamin D level; Echogenic intracardiac focus of fetus on prenatal ultrasound; Hx of forceps delivery in prior pregnancy, currently pregnant, third trimester; Breech presentation on examination, other fetus; and GBS (group B Streptococcus carrier), +RV culture, currently pregnant on their problem list.  Patient reports no complaints.  Contractions: Not present. Vag. Bleeding: None.  Movement: Present. Denies leaking of fluid.   The following portions of the patient's history were reviewed and updated as appropriate: allergies, current medications, past family history, past medical history, past social history, past surgical history and problem list.   Objective:   Vitals:   07/18/20 1011  BP: 130/89  Pulse: (!) 109  Weight: 158 lb (71.7 kg)    Fetal Status: Fetal Heart Rate (bpm): 137   Movement: Present  Presentation: Homero Fellers Breech  General:  Alert, oriented and cooperative. Patient is in no acute distress.  Skin: Skin is warm and dry. No rash noted.   Cardiovascular: Normal heart rate noted  Respiratory: Normal respiratory effort, no problems with respiration noted  Abdomen: Soft, gravid, appropriate for gestational age.  Pain/Pressure: Absent     Pelvic: Cervical exam deferred        Extremities: Normal range of motion.  Edema: None  Mental Status: Normal mood and affect. Normal behavior. Normal judgment and thought content.   Assessment and Plan:  Pregnancy: [id number] at [redacted]w[redacted]d 1. Supervision of other normal pregnancy, antepartum Routine care  2. Hx of forceps delivery in prior pregnancy, currently pregnant, third trimester   3. GBS (group B Streptococcus carrier), +RV culture, currently  pregnant Will need treatment in labor  4. Breech presentation on examination, other fetus Discussed options at length, including ECV, primary C-section or vaginal breech delivery--discussed risks of all, she will consider options, discuss with family and make an informed decision.  Term labor symptoms and general obstetric precautions including but not limited to vaginal bleeding, contractions, leaking of fluid and fetal movement were reviewed in detail with the patient. Please refer to After Visit Summary for other counseling recommendations.   Return in 1 week (on 07/25/2020).  Future Appointments  Date Time Provider Department Center  07/25/2020  2:00 PM Federico Flake, MD [facility]  08/01/2020  8:30 AM Calvert Cantor, CNM [facility]    Reva Bores, MD

## 2020-07-18 NOTE — Patient Instructions (Signed)

## 2020-07-25 ENCOUNTER — Ambulatory Visit (INDEPENDENT_AMBULATORY_CARE_PROVIDER_SITE_OTHER): Payer: No Typology Code available for payment source | Admitting: Family Medicine

## 2020-07-25 ENCOUNTER — Other Ambulatory Visit: Payer: Self-pay

## 2020-07-25 ENCOUNTER — Encounter: Payer: Self-pay | Admitting: Family Medicine

## 2020-07-25 VITALS — BP 122/87 | HR 101 | Wt 158.0 lb

## 2020-07-25 DIAGNOSIS — O09293 Supervision of pregnancy with other poor reproductive or obstetric history, third trimester: Secondary | ICD-10-CM

## 2020-07-25 DIAGNOSIS — O99820 Streptococcus B carrier state complicating pregnancy: Secondary | ICD-10-CM

## 2020-07-25 DIAGNOSIS — O321XX9 Maternal care for breech presentation, other fetus: Secondary | ICD-10-CM

## 2020-07-25 DIAGNOSIS — Z3480 Encounter for supervision of other normal pregnancy, unspecified trimester: Secondary | ICD-10-CM

## 2020-07-25 NOTE — Progress Notes (Signed)
   PRENATAL VISIT NOTE  Subjective:  Krista Russell is a 35 y.o. [phone] at [redacted]w[redacted]d being seen today for ongoing prenatal care.  She is currently monitored for the following issues for this low-risk pregnancy and has Supervision of other normal pregnancy, antepartum; Recurrent pregnancy loss; Low vitamin D level; Echogenic intracardiac focus of fetus on prenatal ultrasound; Hx of forceps delivery in prior pregnancy, currently pregnant, third trimester; Breech presentation on examination, other fetus; and GBS (group B Streptococcus carrier), +RV culture, currently pregnant on their problem list.  Patient reports no complaints.  Contractions: Not present. Vag. Bleeding: None.  Movement: Present. Denies leaking of fluid.   The following portions of the patient's history were reviewed and updated as appropriate: allergies, current medications, past family history, past medical history, past social history, past surgical history and problem list.   Objective:   Vitals:   07/25/20 1423  BP: 122/87  Pulse: (!) 101  Weight: 158 lb (71.7 kg)    Fetal Status: Fetal Heart Rate (bpm): 132   Movement: Present  Presentation: Homero Fellers Breech  General:  Alert, oriented and cooperative. Patient is in no acute distress.  Skin: Skin is warm and dry. No rash noted.   Cardiovascular: Normal heart rate noted  Respiratory: Normal respiratory effort, no problems with respiration noted  Abdomen: Soft, gravid, appropriate for gestational age.  Pain/Pressure: Absent     Pelvic: Cervical exam deferred        Extremities: Normal range of motion.  Edema: None  Mental Status: Normal mood and affect. Normal behavior. Normal judgment and thought content.   Assessment and Plan:  Pregnancy: [id number] at 104w2d  1. Breech presentation on examination, other fetus Tried chiropractic, moxibustion, spinning babies. Still breech on [place] today Decided on primary CS, feels comfortable with decision Reviewed available options, desires  pLTCS with Dr. Alvester Morin on 3/2, RN to schedule Orders for C-section in signed held Called LD OR ---scheduled for 3/2 @10 :30 with Dr  2. GBS (group B Streptococcus carrier), +RV culture, currently pregnant Anceph for CS  3. Hx of forceps delivery in prior pregnancy, currently pregnant, third trimester Informed her decision for pLTCS  4. Supervision of other normal pregnancy, antepartum Up to date Pap at postpartum   Term labor symptoms and general obstetric precautions including but not limited to vaginal bleeding, contractions, leaking of fluid and fetal movement were reviewed in detail with the patient. Please refer to After Visit Summary for other counseling recommendations.   Return in about 2 weeks (around 08/08/2020) for incision check after CS.  Future Appointments  Date Time Provider Department Center  08/08/2020 10:30 AM [facility] NURSE [facility]  09/05/2020 10:30 AM 11/05/2020, MD [facility]    Federico Flake, MD

## 2020-07-26 ENCOUNTER — Encounter (HOSPITAL_COMMUNITY): Payer: Self-pay

## 2020-07-26 NOTE — Patient Instructions (Signed)
Valia Wingard  07/26/2020   Your procedure is scheduled on:  08/01/2020  Arrive at 0845 at Entrance C on [address] at [facility]  and [address]. You are invited to use the FREE valet parking or use the Visitor's parking deck.  Pick up the phone at the desk and dial [phone].  Call this number if you have problems the morning of surgery: [phone]  Remember:   Do not eat food:(After Midnight) Desps de medianoche.  Do not drink clear liquids: (After Midnight) Desps de medianoche.  Take these medicines the morning of surgery with A SIP OF WATER:  none   Do not wear jewelry, make-up or nail polish.  Do not wear lotions, powders, or perfumes. Do not wear deodorant.  Do not shave 48 hours prior to surgery.  Do not bring valuables to the hospital.  [facility] is not   responsible for any belongings or valuables brought to the hospital.  Contacts, dentures or bridgework may not be worn into surgery.  Leave suitcase in the car. After surgery it may be brought to your room.  For patients admitted to the hospital, checkout time is 11:00 AM the day of              discharge.      Please read over the following fact sheets that you were given:     Preparing for Surgery

## 2020-07-30 ENCOUNTER — Other Ambulatory Visit: Payer: Self-pay

## 2020-07-30 ENCOUNTER — Encounter (HOSPITAL_COMMUNITY)
Admission: RE | Admit: 2020-07-30 | Discharge: 2020-07-30 | Disposition: A | Discharge status: Home / Self Care | Payer: No Typology Code available for payment source | Source: Ambulatory Visit | Attending: Family Medicine | Admitting: Family Medicine

## 2020-07-30 DIAGNOSIS — Z01812 Encounter for preprocedural laboratory examination: Secondary | ICD-10-CM | POA: Insufficient documentation

## 2020-07-30 HISTORY — DX: Other specified health status: Z78.9

## 2020-07-30 LAB — TYPE AND SCREEN
ABO/RH(D): O POS
Antibody Screen: NEGATIVE

## 2020-07-30 LAB — CBC
HCT: 33.5 % — ABNORMAL LOW (ref 36.0–46.0)
Hemoglobin: 10.8 g/dL — ABNORMAL LOW (ref 12.0–15.0)
MCH: 27.4 pg (ref 26.0–34.0)
MCHC: 32.2 g/dL (ref 30.0–36.0)
MCV: 85 fL (ref 80.0–100.0)
Platelets: 279 10*3/uL (ref 150–400)
RBC: 3.94 MIL/uL (ref 3.87–5.11)
RDW: 14.2 % (ref 11.5–15.5)
WBC: 12.1 10*3/uL — ABNORMAL HIGH (ref 4.0–10.5)
nRBC: 0 % (ref 0.0–0.2)

## 2020-07-30 LAB — RAPID HIV SCREEN (HIV 1/2 AB+AG)
HIV 1/2 Antibodies: NONREACTIVE
HIV-1 P24 Antigen - HIV24: NONREACTIVE

## 2020-07-31 ENCOUNTER — Encounter (HOSPITAL_COMMUNITY): Payer: Self-pay | Admitting: Family Medicine

## 2020-07-31 LAB — RPR: RPR Ser Ql: NONREACTIVE

## 2020-07-31 NOTE — Anesthesia Preprocedure Evaluation (Addendum)
Anesthesia Evaluation  Patient identified by MRN, date of birth, ID band Patient awake    Reviewed: Allergy & Precautions, NPO status , Patient's Chart, lab work & pertinent test results  Airway Mallampati: II  TM Distance: >3 FB Neck ROM: Full    Dental no notable dental hx. (+) Teeth Intact, Dental Advisory Given   Pulmonary neg pulmonary ROS,    Pulmonary exam normal breath sounds clear to auscultation       Cardiovascular Exercise Tolerance: Good negative cardio ROS Normal cardiovascular exam Rhythm:Regular Rate:Normal     Neuro/Psych negative neurological ROS     GI/Hepatic negative GI ROS, Neg liver ROS,   Endo/Other  negative endocrine ROS  Renal/GU negative Renal ROS     Musculoskeletal   Abdominal   Peds  Hematology Lab Results      Component                Value               Date                      WBC                      12.1 (H)            07/30/2020                HGB                      10.8 (L)            07/30/2020                HCT                      33.5 (L)            07/30/2020                MCV                      85.0                07/30/2020                PLT                      279                 07/30/2020              Anesthesia Other Findings   Reproductive/Obstetrics (+) Pregnancy                            Anesthesia Physical Anesthesia Plan  ASA: II  Anesthesia Plan: Spinal   Post-op Pain Management:    Induction: Intravenous  PONV Risk Score and Plan: Treatment may vary due to age or medical condition, Ondansetron and Dexamethasone  Airway Management Planned: Natural Airway  Additional Equipment: None  Intra-op Plan:   Post-operative Plan:   Informed Consent: I have reviewed the patients History and Physical, chart, labs and discussed the procedure including the risks, benefits and alternatives for the proposed anesthesia with  the patient or authorized representative who has indicated his/her understanding and acceptance.     Dental advisory given  Plan Discussed  with: CRNA and Anesthesiologist  Anesthesia Plan Comments: (39.1 wk g6P1 for C/S for Breech presentation)       Anesthesia Quick Evaluation

## 2020-08-01 ENCOUNTER — Inpatient Hospital Stay (HOSPITAL_COMMUNITY): Payer: No Typology Code available for payment source | Admitting: Anesthesiology

## 2020-08-01 ENCOUNTER — Encounter (HOSPITAL_COMMUNITY)
Admission: AD | Disposition: A | Discharge status: Home / Self Care | Payer: Self-pay | Source: Home / Self Care | Attending: Family Medicine

## 2020-08-01 ENCOUNTER — Encounter (HOSPITAL_COMMUNITY): Payer: Self-pay | Admitting: Family Medicine

## 2020-08-01 ENCOUNTER — Inpatient Hospital Stay (HOSPITAL_COMMUNITY)
Admission: AD | Admit: 2020-08-01 | Discharge: 2020-08-03 | DRG: 787 | Disposition: A | Discharge status: Home / Self Care | Payer: No Typology Code available for payment source | Attending: Family Medicine | Admitting: Family Medicine

## 2020-08-01 ENCOUNTER — Encounter: Payer: No Typology Code available for payment source | Admitting: Advanced Practice Midwife

## 2020-08-01 DIAGNOSIS — O9902 Anemia complicating childbirth: Secondary | ICD-10-CM | POA: Diagnosis not present

## 2020-08-01 DIAGNOSIS — D62 Acute posthemorrhagic anemia: Secondary | ICD-10-CM | POA: Diagnosis not present

## 2020-08-01 DIAGNOSIS — O99824 Streptococcus B carrier state complicating childbirth: Secondary | ICD-10-CM | POA: Diagnosis present

## 2020-08-01 DIAGNOSIS — Z3A39 39 weeks gestation of pregnancy: Secondary | ICD-10-CM

## 2020-08-01 DIAGNOSIS — O321XX0 Maternal care for breech presentation, not applicable or unspecified: Secondary | ICD-10-CM | POA: Diagnosis present

## 2020-08-01 LAB — ABO/RH: ABO/RH(D): O POS

## 2020-08-01 SURGERY — Surgical Case
Anesthesia: Spinal

## 2020-08-01 MED ORDER — DEXAMETHASONE SODIUM PHOSPHATE 10 MG/ML IJ SOLN
INTRAMUSCULAR | Status: DC | PRN
  Administered 2020-08-01: 10 mg via INTRAVENOUS

## 2020-08-01 MED ORDER — OXYCODONE HCL 5 MG/5ML PO SOLN: 5.0000 mg | Freq: Once | ORAL | Status: DC | PRN

## 2020-08-01 MED ORDER — COCONUT OIL OIL
1.0000 "application " | TOPICAL_OIL | Status: DC | PRN
  Administered 2020-08-02: 1 via TOPICAL

## 2020-08-01 MED ORDER — PRENATAL MULTIVITAMIN CH
1.0000 | ORAL_TABLET | Freq: Every day | ORAL | Status: DC
  Administered 2020-08-02: 1 via ORAL
  Filled 2020-08-01: qty 1

## 2020-08-01 MED ORDER — DIPHENHYDRAMINE HCL 50 MG/ML IJ SOLN: 12.5000 mg | INTRAMUSCULAR | Status: DC | PRN

## 2020-08-01 MED ORDER — NALOXONE HCL 0.4 MG/ML IJ SOLN: 0.4000 mg | INTRAMUSCULAR | Status: DC | PRN

## 2020-08-01 MED ORDER — OXYTOCIN-SODIUM CHLORIDE 30-0.9 UT/500ML-% IV SOLN: 2.5000 [IU]/h | INTRAVENOUS | Status: AC

## 2020-08-01 MED ORDER — SOD CITRATE-CITRIC ACID 500-334 MG/5ML PO SOLN
ORAL | Status: AC
  Filled 2020-08-01: qty 30

## 2020-08-01 MED ORDER — KETOROLAC TROMETHAMINE 30 MG/ML IJ SOLN
30.0000 mg | Freq: Once | INTRAMUSCULAR | Status: AC | PRN
  Administered 2020-08-01: 30 mg via INTRAVENOUS

## 2020-08-01 MED ORDER — FENTANYL CITRATE (PF) 100 MCG/2ML IJ SOLN
INTRAMUSCULAR | Status: AC
  Filled 2020-08-01: qty 2

## 2020-08-01 MED ORDER — DIPHENHYDRAMINE HCL 25 MG PO CAPS
25.0000 mg | ORAL_CAPSULE | Freq: Four times a day (QID) | ORAL | Status: DC | PRN
Start: 2020-08-01 — End: 2020-08-03

## 2020-08-01 MED ORDER — ACETAMINOPHEN 500 MG PO TABS
1000.0000 mg | ORAL_TABLET | Freq: Three times a day (TID) | ORAL | Status: DC
  Administered 2020-08-01 – 2020-08-03 (×6): 1000 mg via ORAL
  Filled 2020-08-01 (×6): qty 2

## 2020-08-01 MED ORDER — DEXTROSE 5 % IV SOLN
1.0000 ug/kg/h | INTRAVENOUS | Status: DC | PRN
  Filled 2020-08-01: qty 5

## 2020-08-01 MED ORDER — NALBUPHINE HCL 10 MG/ML IJ SOLN: 5.0000 mg | INTRAMUSCULAR | Status: DC | PRN

## 2020-08-01 MED ORDER — PHENYLEPHRINE HCL-NACL 20-0.9 MG/250ML-% IV SOLN
INTRAVENOUS | Status: AC
  Filled 2020-08-01: qty 250

## 2020-08-01 MED ORDER — MORPHINE SULFATE (PF) 0.5 MG/ML IJ SOLN
INTRAMUSCULAR | Status: AC
  Filled 2020-08-01: qty 10

## 2020-08-01 MED ORDER — HYDROMORPHONE HCL 1 MG/ML IJ SOLN: 0.2500 mg | INTRAMUSCULAR | Status: DC | PRN

## 2020-08-01 MED ORDER — IBUPROFEN 800 MG PO TABS
800.0000 mg | ORAL_TABLET | Freq: Four times a day (QID) | ORAL | Status: DC
  Administered 2020-08-02 – 2020-08-03 (×3): 800 mg via ORAL
  Filled 2020-08-01 (×3): qty 1

## 2020-08-01 MED ORDER — TETANUS-DIPHTH-ACELL PERTUSSIS 5-2.5-18.5 LF-MCG/0.5 IM SUSY: 0.5000 mL | PREFILLED_SYRINGE | Freq: Once | INTRAMUSCULAR | Status: DC

## 2020-08-01 MED ORDER — BUPIVACAINE IN DEXTROSE 0.75-8.25 % IT SOLN
INTRATHECAL | Status: DC | PRN
  Administered 2020-08-01: 12 mg via INTRATHECAL

## 2020-08-01 MED ORDER — ONDANSETRON HCL 4 MG/2ML IJ SOLN
4.0000 mg | Freq: Three times a day (TID) | INTRAMUSCULAR | Status: DC | PRN
Start: 2020-08-01 — End: 2020-08-03

## 2020-08-01 MED ORDER — DIBUCAINE (PERIANAL) 1 % EX OINT: 1.0000 "application " | TOPICAL_OINTMENT | CUTANEOUS | Status: DC | PRN

## 2020-08-01 MED ORDER — SENNOSIDES-DOCUSATE SODIUM 8.6-50 MG PO TABS
2.0000 | ORAL_TABLET | ORAL | Status: DC
  Filled 2020-08-01: qty 2

## 2020-08-01 MED ORDER — CEFAZOLIN SODIUM-DEXTROSE 2-4 GM/100ML-% IV SOLN
2.0000 g | INTRAVENOUS | Status: AC
  Administered 2020-08-01: 2 g via INTRAVENOUS

## 2020-08-01 MED ORDER — SIMETHICONE 80 MG PO CHEW
80.0000 mg | CHEWABLE_TABLET | ORAL | Status: DC | PRN
Start: 2020-08-01 — End: 2020-08-03

## 2020-08-01 MED ORDER — OXYCODONE HCL 5 MG PO TABS: 5.0000 mg | ORAL_TABLET | Freq: Once | ORAL | Status: DC | PRN

## 2020-08-01 MED ORDER — OXYTOCIN-SODIUM CHLORIDE 30-0.9 UT/500ML-% IV SOLN
INTRAVENOUS | Status: AC
  Filled 2020-08-01: qty 500

## 2020-08-01 MED ORDER — ENOXAPARIN SODIUM 40 MG/0.4ML ~~LOC~~ SOLN
40.0000 mg | SUBCUTANEOUS | Status: DC
  Administered 2020-08-02: 40 mg via SUBCUTANEOUS
  Filled 2020-08-01 (×2): qty 0.4

## 2020-08-01 MED ORDER — MORPHINE SULFATE (PF) 0.5 MG/ML IJ SOLN
INTRAMUSCULAR | Status: DC | PRN
  Administered 2020-08-01: 150 ug via INTRATHECAL

## 2020-08-01 MED ORDER — LACTATED RINGERS IV SOLN: INTRAVENOUS | Status: DC

## 2020-08-01 MED ORDER — DIPHENHYDRAMINE HCL 25 MG PO CAPS: 25.0000 mg | ORAL_CAPSULE | ORAL | Status: DC | PRN

## 2020-08-01 MED ORDER — CEFAZOLIN SODIUM-DEXTROSE 2-4 GM/100ML-% IV SOLN
INTRAVENOUS | Status: AC
  Filled 2020-08-01: qty 100

## 2020-08-01 MED ORDER — SIMETHICONE 80 MG PO CHEW
80.0000 mg | CHEWABLE_TABLET | Freq: Three times a day (TID) | ORAL | Status: DC
  Administered 2020-08-01 – 2020-08-02 (×4): 80 mg via ORAL
  Filled 2020-08-01 (×4): qty 1

## 2020-08-01 MED ORDER — ONDANSETRON HCL 4 MG/2ML IJ SOLN
INTRAMUSCULAR | Status: AC
  Filled 2020-08-01: qty 2

## 2020-08-01 MED ORDER — KETOROLAC TROMETHAMINE 30 MG/ML IJ SOLN
INTRAMUSCULAR | Status: AC
  Filled 2020-08-01: qty 1

## 2020-08-01 MED ORDER — DEXAMETHASONE SODIUM PHOSPHATE 10 MG/ML IJ SOLN
INTRAMUSCULAR | Status: AC
  Filled 2020-08-01: qty 1

## 2020-08-01 MED ORDER — OXYTOCIN-SODIUM CHLORIDE 30-0.9 UT/500ML-% IV SOLN
INTRAVENOUS | Status: DC | PRN
  Administered 2020-08-01: 200 mL via INTRAVENOUS
  Administered 2020-08-01: 300 mL via INTRAVENOUS

## 2020-08-01 MED ORDER — SOD CITRATE-CITRIC ACID 500-334 MG/5ML PO SOLN
30.0000 mL | ORAL | Status: AC
  Administered 2020-08-01: 30 mL via ORAL

## 2020-08-01 MED ORDER — SODIUM CHLORIDE 0.9% FLUSH: 3.0000 mL | INTRAVENOUS | Status: DC | PRN

## 2020-08-01 MED ORDER — LACTATED RINGERS IV SOLN: INTRAVENOUS | Status: DC | PRN

## 2020-08-01 MED ORDER — WITCH HAZEL-GLYCERIN EX PADS: 1.0000 "application " | MEDICATED_PAD | CUTANEOUS | Status: DC | PRN

## 2020-08-01 MED ORDER — ONDANSETRON HCL 4 MG/2ML IJ SOLN: 4.0000 mg | Freq: Once | INTRAMUSCULAR | Status: DC | PRN

## 2020-08-01 MED ORDER — SCOPOLAMINE 1 MG/3DAYS TD PT72: 1.0000 | MEDICATED_PATCH | Freq: Once | TRANSDERMAL | Status: DC

## 2020-08-01 MED ORDER — FENTANYL CITRATE (PF) 100 MCG/2ML IJ SOLN
INTRAMUSCULAR | Status: DC | PRN
  Administered 2020-08-01: 15 ug via INTRATHECAL

## 2020-08-01 MED ORDER — KETOROLAC TROMETHAMINE 30 MG/ML IJ SOLN
30.0000 mg | Freq: Four times a day (QID) | INTRAMUSCULAR | Status: AC
  Administered 2020-08-01 – 2020-08-02 (×2): 30 mg via INTRAVENOUS
  Filled 2020-08-01 (×3): qty 1

## 2020-08-01 MED ORDER — MENTHOL 3 MG MT LOZG: 1.0000 | LOZENGE | OROMUCOSAL | Status: DC | PRN

## 2020-08-01 MED ORDER — NALBUPHINE HCL 10 MG/ML IJ SOLN
5.0000 mg | Freq: Once | INTRAMUSCULAR | Status: DC | PRN
Start: 2020-08-01 — End: 2020-08-03

## 2020-08-01 MED ORDER — ONDANSETRON HCL 4 MG/2ML IJ SOLN
INTRAMUSCULAR | Status: DC | PRN
  Administered 2020-08-01: 4 mg via INTRAVENOUS

## 2020-08-01 MED ORDER — OXYCODONE HCL 5 MG PO TABS: 5.0000 mg | ORAL_TABLET | ORAL | Status: DC | PRN

## 2020-08-01 MED ORDER — NALBUPHINE HCL 10 MG/ML IJ SOLN: 5.0000 mg | Freq: Once | INTRAMUSCULAR | Status: DC | PRN

## 2020-08-01 MED ORDER — PHENYLEPHRINE HCL-NACL 20-0.9 MG/250ML-% IV SOLN
INTRAVENOUS | Status: DC | PRN
  Administered 2020-08-01: 60 ug/min via INTRAVENOUS
  Administered 2020-08-01: 90 ug/min via INTRAVENOUS

## 2020-08-01 SURGICAL SUPPLY — 27 items
BENZOIN TINCTURE PRP APPL 2/3 (GAUZE/BANDAGES/DRESSINGS) ×2 IMPLANT
CHLORAPREP W/TINT 26ML (MISCELLANEOUS) ×2 IMPLANT
CLOSURE STERI STRIP 1/2 X4 (GAUZE/BANDAGES/DRESSINGS) ×2 IMPLANT
CLOTH BEACON ORANGE TIMEOUT ST (SAFETY) ×2 IMPLANT
DRSG OPSITE POSTOP 4X10 (GAUZE/BANDAGES/DRESSINGS) ×2 IMPLANT
ELECT REM PT RETURN 9FT ADLT (ELECTROSURGICAL) ×2
ELECTRODE REM PT RTRN 9FT ADLT (ELECTROSURGICAL) ×1 IMPLANT
GLOVE BIOGEL PI IND STRL 7.0 (GLOVE) ×3 IMPLANT
GLOVE BIOGEL PI INDICATOR 7.0 (GLOVE) ×3
GLOVE ECLIPSE 6.5 STRL STRAW (GLOVE) ×2 IMPLANT
GOWN STRL REUS W/ TWL LRG LVL3 (GOWN DISPOSABLE) ×2 IMPLANT
GOWN STRL REUS W/TWL LRG LVL3 (GOWN DISPOSABLE) ×2
NS IRRIG 1000ML POUR BTL (IV SOLUTION) ×2 IMPLANT
PAD OB MATERNITY 4.3X12.25 (PERSONAL CARE ITEMS) ×2 IMPLANT
PAD PREP 24X48 CUFFED NSTRL (MISCELLANEOUS) ×2 IMPLANT
RETRACTOR WND ALEXIS 25 LRG (MISCELLANEOUS) IMPLANT
RTRCTR C-SECT PINK 25CM LRG (MISCELLANEOUS) ×2 IMPLANT
RTRCTR WOUND ALEXIS 25CM LRG (MISCELLANEOUS)
STRIP CLOSURE SKIN 1/2X4 (GAUZE/BANDAGES/DRESSINGS) ×2 IMPLANT
SUT PLAIN 2 0 XLH (SUTURE) ×2 IMPLANT
SUT VIC AB 0 CT1 36 (SUTURE) ×4 IMPLANT
SUT VIC AB 2-0 CT1 27 (SUTURE) ×1
SUT VIC AB 2-0 CT1 TAPERPNT 27 (SUTURE) ×1 IMPLANT
SUT VIC AB 4-0 KS 27 (SUTURE) ×2 IMPLANT
TOWEL OR 17X24 6PK STRL BLUE (TOWEL DISPOSABLE) ×6 IMPLANT
TRAY FOLEY CATH SILVER 16FR (SET/KITS/TRAYS/PACK) ×2 IMPLANT
WATER STERILE IRR 1000ML POUR (IV SOLUTION) ×2 IMPLANT

## 2020-08-01 NOTE — Anesthesia Procedure Notes (Signed)
Spinal  Patient location during procedure: OB Start time: 08/01/2020 10:46 AM End time: 08/01/2020 10:49 AM Staffing Performed: anesthesiologist  Anesthesiologist: Trevor Iha, MD Preanesthetic Checklist Completed: patient identified, IV checked, risks and benefits discussed, surgical consent, monitors and equipment checked, pre-op evaluation and timeout performed Spinal Block Patient position: sitting Prep: DuraPrep and site prepped and draped Patient monitoring: heart rate, cardiac monitor, continuous pulse ox and blood pressure Approach: midline Location: L3-4 Injection technique: single-shot Needle Needle type: Pencan  Needle gauge: 24 G Needle length: 10 cm Needle insertion depth: 6 cm Assessment Sensory level: T4 Additional Notes  1 Attempt (s). Pt tolerated procedure well.

## 2020-08-01 NOTE — Op Note (Signed)
Operative Note   SURGERY DATE: 08/01/2020  PRE-OP DIAGNOSIS:  *Pregnancy at [redacted]w[redacted]d * Breech presentation   POST-OP DIAGNOSIS: Same    PROCEDURE: primary low transverse cesarean section via pfannenstiel skin incision with double layer uterine closure  SURGEON: Surgeon(s) and Role:    Federico Flake, MD - Primary    * Deontez Klinke, Arlana Pouch, MD - Fellow  ANESTHESIA: spinal  ESTIMATED BLOOD LOSS: 345  DRAINS: 100 mL UOP via indwelling foley  TOTAL IV FLUIDS: crystalloid  VTE PROPHYLAXIS: SCDs to bilateral lower extremities  ANTIBIOTICS: Two grams of Cefazolin were given., within 1 hour of skin incision  SPECIMENS: placenta to L&D   COMPLICATIONS: none   INDICATIONS: breech presentation 3  FINDINGS: No intra-abdominal adhesions were noted. Grossly normal uterus, tubes and ovaries. clear amniotic fluid, breech female infant, weight pending, APGARs 9/9, intact placenta.  PROCEDURE IN DETAIL: The patient was taken to the operating room where anesthesia was administered and normal fetal heart tones were confirmed. She was then prepped and draped in the normal fashion in the dorsal supine position with a leftward tilt.  After a time out was performed, a pfannensteil skin incision was made with the scalpel and carried through to the underlying layer of fascia. The fascia was then incised at the midline and this incision was extended laterally with the mayo scissors. Attention was turned to the superior aspect of the fascial incision which was grasped with the kocher clamps x 2, tented up and the rectus muscles were dissected off bluntly. In a similar fashion the inferior aspect of the fascial incision was grasped with the kocher clamps, tented up and the rectus muscles dissected off with the mayo scissors. The rectus muscles were then separated in the midline and the peritoneum was entered bluntly. The alexis retractor was inserted.   A low transverse hysterotomy was made with the  scalpel until the endometrial cavity was breached and the amniotic sac ruptured, yielding clear amniotic fluid. This incision was extended bluntly and the infant was delivered breech using standard breech maneuvers. The cord was clamped x 2 and cut, and the infant was handed to the awaiting pediatricians, after delayed cord clamping was done.  The placenta was then gradually expressed from the uterus and then the uterus was exteriorized and cleared of all clots and debris. The hysterotomy was repaired with a running suture of 1-0 vicryl. A second imbricating layer of 1-0 vicryl suture was then placed.    The uterus and adnexa were then returned to the abdomen, and the hysterotomy and all operative sites were reinspected and excellent hemostasis was noted after irrigation and suction of the abdomen with warm saline.  The peritoneum was closed with a running stitch of 3-0 Vicryl. The fascia was reapproximated with 0 Vicryl in a simple running fashion bilaterally. The subcutaneous layer was then reapproximated with interrupted sutures of 2-0 plain gut, and the skin was then closed with 4-0 vicryl, in a subcuticular fashion.  The patient  tolerated the procedure well. Sponge, lap, needle, and instrument counts were correct x 2. The patient was transferred to the recovery room awake, alert and breathing independently in stable condition.   Casper Harrison, MD [facility] Fellow, [facility], [facility]

## 2020-08-01 NOTE — Discharge Summary (Signed)
Postpartum Discharge Summary    Patient Name: Krista Russell DOB: 18-Mar-1986 MRN: [id number]  Date of admission: 08/01/2020 Delivery date:08/01/2020  Delivering provider: Caren Macadam  Date of discharge: 08/03/2020  Admitting diagnosis: Breech presentation, single or unspecified fetus [O32.1XX0] Cesarean delivery delivered [O82] Intrauterine pregnancy: [redacted]w[redacted]d    Secondary diagnosis:  Active Problems:   Breech presentation, single or unspecified fetus   Cesarean delivery delivered   Acute blood loss anemia  Additional problems: none    Discharge diagnosis: Term Pregnancy Delivered                                              Post partum procedures:none Augmentation: N/A Complications: None  Hospital course: Sceduled C/S   35y.o. yo [id number] 317w2das admitted to the hospital 08/01/2020 for scheduled cesarean section with the following indication:Malpresentation.Delivery details are as follows:  Membrane Rupture Time/Date: 11:20 AM ,08/01/2020   Delivery Method:C-Section, Low Transverse  Details of operation can be found in separate operative note.  Patient had an uncomplicated postpartum course. On POD#1 her hgb was 9.3 and had been 10.8 preop> Fe QOD was started.  She is ambulating, tolerating a regular diet, passing flatus, and urinating well. Patient is discharged home in stable condition on  08/03/20        Newborn Data: Birth date:08/01/2020  Birth time:11:20 AM  Gender:Female  Living status:Living  Apgars:9 ,9  Weight:3025 g     Magnesium Sulfate received: No BMZ received: No Rhophylac:N/A MMR:N/A T-DaP:declined Flu: No Transfusion:No  Physical exam  Vitals:   08/02/20 0815 08/02/20 1434 08/02/20 2137 08/03/20 0614  BP: 118/82 119/78 117/89 121/80  Pulse: 86 97 93 90  Resp: '16 17 15 16  ' Temp: 98.4 F (36.9 C) 98.4 F (36.9 C) 98.3 F (36.8 C) 98.1 F (36.7 C)  TempSrc: Oral Oral Oral Oral  SpO2: 99% 98% 99% 100%  Weight:      Height:        General: alert, cooperative and no distress Lochia: appropriate Uterine Fundus: firm Incision: Healing well with no significant drainage DVT Evaluation: No evidence of DVT seen on physical exam. Labs: Lab Results  Component Value Date   WBC 14.5 (H) 08/02/2020   HGB 9.3 (L) 08/02/2020   HCT 29.5 (L) 08/02/2020   MCV 86.3 08/02/2020   PLT 269 08/02/2020   CMP Latest Ref Rng & Units 08/02/2020  Creatinine 0.44 - 1.00 mg/dL 0.58   Edinburgh Score: Edinburgh Postnatal Depression Scale Screening Tool 08/02/2020  I have been able to laugh and see the funny side of things. 0  I have looked forward with enjoyment to things. 0  I have blamed myself unnecessarily when things went wrong. 0  I have been anxious or worried for no good reason. 1  I have felt scared or panicky for no good reason. 0  Things have been getting on top of me. 0  I have been so unhappy that I have had difficulty sleeping. 0  I have felt sad or miserable. 0  I have been so unhappy that I have been crying. 0  The thought of harming myself has occurred to me. 0  Edinburgh Postnatal Depression Scale Total 1     After visit meds:  Allergies as of 08/03/2020   No Known Allergies     Medication List  TAKE these medications   acetaminophen 500 MG tablet Commonly known as: TYLENOL Take 2 tablets (1,000 mg total) by mouth every 8 (eight) hours.   coconut oil Oil Apply 1 application topically as needed.   ferrous sulfate 325 (65 FE) MG tablet Take 1 tablet (325 mg total) by mouth every other day.   ibuprofen 800 MG tablet Commonly known as: ADVIL Take 1 tablet (800 mg total) by mouth every 6 (six) hours.   multivitamin-prenatal 27-0.8 MG Tabs tablet Take 1 tablet by mouth daily at 12 noon.   oxyCODONE 5 MG immediate release tablet Commonly known as: Oxy IR/ROXICODONE Take 1-2 tablets (5-10 mg total) by mouth every 4 (four) hours as needed for moderate pain.   senna-docusate 8.6-50 MG tablet Commonly  known as: Senokot-S Take 2 tablets by mouth daily.   Vitamin D 10 MCG/ML Liqd Take 500 Units by mouth daily.            Discharge Care Instructions  (From admission, onward)         Start     Ordered   08/03/20 0000  Discharge wound care:       Comments: Remove honeycomb 5-7 days after surgery. This may be removed sooner if it is starting to fall off.   08/03/20 0727           Discharge home in stable condition Infant Feeding: Breast Infant Disposition:home with mother Discharge instruction: per After Visit Summary and Postpartum booklet. Activity: Advance as tolerated. Pelvic rest for 6 weeks.  Diet: routine diet Future Appointments: Future Appointments  Date Time Provider [place]  08/08/2020 10:30 AM [facility] NURSE [facility]  09/05/2020 10:30 AM Caren Macadam, MD [facility]   Follow up Visit:   Please schedule this patient for a In person postpartum visit in 6 weeks with the following provider: Any provider. Additional Postpartum F/U:Incision check 1 week  Low risk pregnancy complicated by: none Delivery mode:  C-Section, Low Transverse  Anticipated Birth Control:  Unsure   08/03/2020 Janet Berlin, MD

## 2020-08-01 NOTE — Transfer of Care (Signed)
Immediate Anesthesia Transfer of Care Note  Patient: Krista Russell  Procedure(s) Performed: CESAREAN SECTION (N/A )  Patient Location: PACU  Anesthesia Type:Spinal  Level of Consciousness: awake  Airway & Oxygen Therapy: Patient Spontanous Breathing  Post-op Assessment: Report given to RN  Post vital signs: Reviewed and stable  Last Vitals:  Vitals Value Taken Time  BP 116/70 08/01/20 1200  Temp    Pulse 110 08/01/20 1202  Resp 19 08/01/20 1202  SpO2 97 % 08/01/20 1202  Vitals shown include unvalidated device data.  Last Pain:  Vitals:   08/01/20 0850  TempSrc: Oral         Complications: No complications documented.

## 2020-08-01 NOTE — H&P (Signed)
Obstetric Preoperative History and Physical  Krista Russell is a 35 y.o. [phone] with IUP at [redacted]w[redacted]d presenting for presenting for scheduled cesarean section.  No acute concerns. Breech presentation, confirmed on [place] today  Prenatal Course Source of Care: [place]  with onset of care at 12 weeks Pregnancy complications or risks: Patient Active Problem List   Diagnosis Date Noted  . Breech presentation, single or unspecified fetus 08/01/2020  . GBS (group B Streptococcus carrier), +RV culture, currently pregnant 07/16/2020  . Breech presentation on examination, other fetus 07/11/2020  . Hx of forceps delivery in prior pregnancy, currently pregnant, third trimester 05/23/2020  . Echogenic intracardiac focus of fetus on prenatal ultrasound 03/12/2020  . Low vitamin D level 01/27/2020  . Supervision of other normal pregnancy, antepartum 01/25/2020  . 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   Prenatal labs and studies: ABO, Rh: --/--/O POS (02/28 1107) Antibody: NEG (02/28 1107) Rubella: 2.21 (08/25 1457) RPR: NON REACTIVE (02/28 1058)  HBsAg: Negative (08/25 1457)  HIV: NON REACTIVE (02/28 1103)  DQQ:IWLNLGXQ/-- (02/09 1409) 1 hr Glucola  Normal  Genetic screening normal Anatomy US normal  Prenatal Transfer Tool  Maternal Diabetes: No Genetic Screening: Normal Maternal Ultrasounds/Referrals: Normal Fetal Ultrasounds or other Referrals:  None Maternal Substance Abuse:  No Significant Maternal Medications:  None Significant Maternal Lab Results: Group B Strep negative  Past Medical History:  Diagnosis Date  . Medical history non-contributory     Past Surgical History:  Procedure Laterality Date  . WISDOM TOOTH EXTRACTION      OB History  Gravida Para Term Preterm AB Living  6 1 1   4 1   SAB IAB Ectopic Multiple Live Births  4       1    # Outcome Date GA Lbr Len/2nd Weight Sex Delivery Anes PTL Lv  6  Current           5 SAB 05/2019          4 SAB 03/2019          3 SAB 10/2018          2 Term 02/09/16    F Vag-Forceps   LIV  1 SAB 2016            Obstetric Comments  All very early SABs that passed spontaneously.    Social History   Socioeconomic History  . Marital status: Married    Spouse name: Not on file  . Number of children: Not on file  . Years of education: Not on file  . Highest education level: Not on file  Occupational History  . Not on file  Tobacco Use  . Smoking status: Never Smoker  . Smokeless tobacco: Never Used  Vaping Use  . Vaping Use: Never used  Substance and Sexual Activity  . Alcohol use: Not Currently  . Drug use: Never  . Sexual activity: Yes    Birth control/protection: None  Other Topics Concern  . Not on file  Social History Narrative  . Not on file   Social Determinants of Health   Financial Resource Strain: Not on file  Food Insecurity: Not on file  Transportation Needs: Not on file  Physical Activity: Not on file  Stress: Not on file  Social Connections: Not on file    Family History  Problem Relation Age of Onset  . Colon cancer Maternal Grandmother   . Breast cancer Paternal  Grandmother     Medications Prior to Admission  Medication Sig Dispense Refill Last Dose  . Cholecalciferol (VITAMIN D) 10 MCG/ML LIQD Take 500 Units by mouth daily.   Past Week at Unknown time  . Prenatal Vit-Fe Fumarate-FA (MULTIVITAMIN-PRENATAL) 27-0.8 MG TABS tablet Take 1 tablet by mouth daily at 12 noon.   Past Week at Unknown time    No Known Allergies  Review of Systems: Negative except for what is mentioned in HPI.  Physical Exam: BP (!) 134/101   Pulse 95   Temp 97.9 F (36.6 C) (Oral)   Resp 18   Ht 5\' 5"  (1.651 m)   Wt 72 kg   LMP 10/31/2019   BMI 26.41 kg/m  FHR by Doppler: 130 bpm CONSTITUTIONAL: Well-developed, well-nourished female in no acute distress.  HENT:  Normocephalic, atraumatic, External right and left ear  normal. Oropharynx is clear and moist EYES: Conjunctivae and EOM are normal. Pupils are equal, round, and reactive to light. No scleral icterus.  NECK: Normal range of motion, supple, no masses SKIN: Skin is warm and dry. No rash noted. Not diaphoretic. No erythema. No pallor. NEUROLGIC: Alert and oriented to person, place, and time. Normal reflexes, muscle tone coordination. No cranial nerve deficit noted. PSYCHIATRIC: Normal mood and affect. Normal behavior. Normal judgment and thought content. CARDIOVASCULAR: Normal heart rate noted, regular rhythm RESPIRATORY: Effort and breath sounds normal, no problems with respiration noted ABDOMEN: Soft, nontender, nondistended, gravid.  MUSCULOSKELETAL: Normal range of motion. No edema and no tenderness. 2+ distal pulses.   Pertinent Labs/Studies:   Results for orders placed or performed during the hospital encounter of 07/30/20 (from the past 72 hour(s))  CBC     Status: Abnormal   Collection Time: 07/30/20 10:58 AM  Result Value Ref Range   WBC 12.1 (H) 4.0 - 10.5 K/uL   RBC 3.94 3.87 - 5.11 MIL/uL   Hemoglobin 10.8 (L) 12.0 - 15.0 g/dL   HCT 08/01/20 (L) 21.1 - 94.1 %   MCV 85.0 80.0 - 100.0 fL   MCH 27.4 26.0 - 34.0 pg   MCHC 32.2 30.0 - 36.0 g/dL   RDW 74.0 81.4 - 48.1 %   Platelets 279 150 - 400 K/uL   nRBC 0.0 0.0 - 0.2 %    Comment: Performed at [facility], [address], [place], [place]  RPR     Status: None   Collection Time: 07/30/20 10:58 AM  Result Value Ref Range   RPR Ser Ql NON REACTIVE NON REACTIVE    Comment: Performed at [facility], [address], [place], [place]  Rapid HIV screen (HIV 1/2 Ab+Ag)     Status: None   Collection Time: 07/30/20 11:03 AM  Result Value Ref Range   HIV-1 P24 Antigen - HIV24 NON REACTIVE NON REACTIVE    Comment: (NOTE) Detection of p24 may be inhibited by biotin in the sample, causing false negative results in acute infection.    HIV 1/2 Antibodies NON  REACTIVE NON REACTIVE   Interpretation (HIV Ag Ab)      A non reactive test result means that HIV 1 or HIV 2 antibodies and HIV 1 p24 antigen were not detected in the specimen.    Comment: Performed at [facility], [address] [place], [place]  Type and screen     Status: None   Collection Time: 07/30/20 11:07 AM  Result Value Ref Range   ABO/RH(D)  O POS    Antibody Screen NEG    Sample Expiration      08/02/2020,2359 Performed at [facility], [address] [place], [place] [address]     Assessment and Plan :Krista Russell is a 35 y.o. [id number] at [redacted]w[redacted]d being admitted being admitted for scheduled cesarean section. The risks of cesarean section discussed with the patient included but were not limited to: bleeding which may require transfusion or reoperation; infection which may require antibiotics; injury to bowel, bladder, ureters or other surrounding organs; injury to the fetus; need for additional procedures including hysterectomy in the event of a life-threatening hemorrhage; placental abnormalities wth subsequent pregnancies, incisional problems, thromboembolic phenomenon and other postoperative/anesthesia complications. The patient concurred with the proposed plan, giving informed written consent for the procedure. Patient has been NPO since last night she will remain NPO for procedure. Anesthesia and OR aware. Preoperative prophylactic antibiotics and SCDs ordered on call to the OR. To OR when ready.    Federico Flake, MD, MPH, ABFM Attending Physician Center for [facility]

## 2020-08-01 NOTE — Anesthesia Postprocedure Evaluation (Signed)
Anesthesia Post Note  Patient: Krista Russell  Procedure(s) Performed: CESAREAN SECTION (N/A )     Patient location during evaluation: Mother Baby Anesthesia Type: Spinal Level of consciousness: oriented and awake and alert Pain management: pain level controlled Vital Signs Assessment: post-procedure vital signs reviewed and stable Respiratory status: spontaneous breathing and respiratory function stable Cardiovascular status: blood pressure returned to baseline and stable Postop Assessment: no headache, no backache, no apparent nausea or vomiting and able to ambulate Anesthetic complications: no   No complications documented.  Last Vitals:  Vitals:   08/01/20 1200 08/01/20 1215  BP: 116/70 115/71  Pulse: (!) 108 (!) 103  Resp: (!) 30 16  Temp: 36.4 C   SpO2: 96% 97%    Last Pain:  Vitals:   08/01/20 1200  TempSrc: Oral   Pain Goal:                Epidural/Spinal Function Cutaneous sensation: Able to Discern Pressure (08/01/20 1215), Patient able to flex knees: No (08/01/20 1215), Patient able to lift hips off bed: No (08/01/20 1215), Back pain beyond tenderness at insertion site: No (08/01/20 1215), Progressively worsening motor and/or sensory loss: No (08/01/20 1215), Bowel and/or bladder incontinence post epidural: No (08/01/20 1215)  Trevor Iha

## 2020-08-01 NOTE — Lactation Note (Signed)
This note was copied from a baby's chart. Lactation Consultation Note  Patient Name: Krista Russell DGLOV'F Date: 08/01/2020 Reason for consult: Initial assessment;Term Age:34 hours  Mom sitting in bed with baby latched to left breast in cradle hold. Mom reports BF is going well, reports h/o poor latch which led to low supply and formula supplementation with first child who is now 53yrs old, mom reports BF and pumping/ supplementing x71mo. If supplementation is needed with this baby mom desires to use own BM first, then [facility].   LC observed baby with tight angle, breast tissue movement, few audible swallows. Advised mom to compress breast tissue and hold baby closer for deeper latch, angle widened. Baby released breast on own ~67min mark, nipple round on release.   Advised cue based feedings, 8-12 q24hrs, wake if >3hrs since last feeding, optimal skin to skin, hand express after each feeding and offer EBM back to baby, avoid pacifiers, pumping and bottles unless indicated for first month. Hand pump given per mom's request. Mom voiced understanding and with no further concerns. BGilliam, RN, IBCLC   Maternal Data Has patient been taught Hand Expression?: No Does the patient have breastfeeding experience prior to this delivery?: Yes How long did the patient breastfeed?: 60mo (BF and formula)  Feeding Mother's Current Feeding Choice: Breast Milk  LATCH Score Latch: Grasps breast easily, tongue down, lips flanged, rhythmical sucking.  Audible Swallowing: A few with stimulation  Type of Nipple: Everted at rest and after stimulation  Comfort (Breast/Nipple): Soft / non-tender  Hold (Positioning): Assistance needed to correctly position infant at breast and maintain latch.  LATCH Score: 8   Lactation Tools Discussed/Used    Interventions Interventions: Breast feeding basics reviewed;Assisted with latch;Skin to skin;Hand pump  Discharge Pump: Personal WIC Program: No  Consult  Status Consult Status: Follow-up Date: 08/02/20 Follow-up type: In-patient    [name] 08/01/2020, 2:20 PM

## 2020-08-02 ENCOUNTER — Encounter (HOSPITAL_COMMUNITY): Payer: Self-pay | Admitting: Family Medicine

## 2020-08-02 DIAGNOSIS — D62 Acute posthemorrhagic anemia: Secondary | ICD-10-CM | POA: Diagnosis not present

## 2020-08-02 LAB — CBC
HCT: 29.5 % — ABNORMAL LOW (ref 36.0–46.0)
Hemoglobin: 9.3 g/dL — ABNORMAL LOW (ref 12.0–15.0)
MCH: 27.2 pg (ref 26.0–34.0)
MCHC: 31.5 g/dL (ref 30.0–36.0)
MCV: 86.3 fL (ref 80.0–100.0)
Platelets: 269 10*3/uL (ref 150–400)
RBC: 3.42 MIL/uL — ABNORMAL LOW (ref 3.87–5.11)
RDW: 14 % (ref 11.5–15.5)
WBC: 14.5 10*3/uL — ABNORMAL HIGH (ref 4.0–10.5)
nRBC: 0 % (ref 0.0–0.2)

## 2020-08-02 LAB — CREATININE, SERUM
Creatinine, Ser: 0.58 mg/dL (ref 0.44–1.00)
GFR, Estimated: >60 mL/min (ref >60)

## 2020-08-02 MED ORDER — FERROUS FUMARATE 324 (106 FE) MG PO TABS
1.0000 | ORAL_TABLET | ORAL | Status: DC
  Administered 2020-08-02: 106 mg via ORAL
  Filled 2020-08-02: qty 1

## 2020-08-02 NOTE — Progress Notes (Signed)
Subjective: Postpartum Day #1: Cesarean Delivery for breech Patient reports tolerating PO; foley still in place, but has ambulated to the BR a couple of times during the night and did well; denies dizziness; breastfeeding going well; declines contraception at this time   Objective: Vital signs in last 24 hours: Temp:  [97.6 F (36.4 C)-98.7 F (37.1 C)] 97.8 F (36.6 C) (03/03 0443) Pulse Rate:  [74-108] 74 (03/03 0443) Resp:  [15-30] 18 (03/03 0443) BP: (102-134)/(60-101) 107/73 (03/03 0443) SpO2:  [96 %-99 %] 99 % (03/03 0443) Weight:  [72 kg] 72 kg (03/02 0850)  Physical Exam:  General: alert, cooperative and mild distress Lochia: appropriate Uterine Fundus: firm Incision: honeycomb intact, approx 30% stained/marked DVT Evaluation: No evidence of DVT seen on physical exam.  Recent Labs    07/30/20 1058 08/02/20 0456  HGB 10.8* 9.3*  HCT 33.5* 29.5*    Assessment/Plan: Status post Cesarean section. Doing well postoperatively.  Continue current care Acute blood loss anemia-mild> start QOD po Fe  Krista Russell CNM 08/02/2020, 7:34 AM

## 2020-08-02 NOTE — Lactation Note (Signed)
This note was copied from a baby's chart. Lactation Consultation Note  Patient Name: Krista Russell [id number]'Y Date: 08/02/2020 Reason for consult: Follow-up assessment Age:35 hours   P2 mother whose infant is now 39 hours old.  This is a term baby at 39+2 weeks.  Mother breast fed/supplemented her first baby for 6 months, although she stated baby did not latch well so it was primarily pumping/supplementing.  Mother is really determined to make breast feeding successful with this baby.  Baby was receiving a hearing screen when I arrived.  Discussed basic breast feeding concepts and addressed mother's main concern which was latching.  Mother has evidence on both nipples of poor latches with earlier feedings.  She is feeling more confident with latching and we reviewed how to obtain and maintain a deep latch.  Stressed the importance of using EBM and coconut oil for comfort.  Provided comfort gels with instructions for use.  Mother can easily demonstrate hand expression.  Colostrum container at bedside.  Mother will feed on cue.  Reviewed cluster feeding for tonight.  Mother has a DEBP for home use.  Father present and supportive.   Maternal Data    Feeding    LATCH Score                    Lactation Tools Discussed/Used    Interventions    Discharge    Consult Status Consult Status: Follow-up Date: 08/03/20 Follow-up type: In-patient    Dora Sims 08/02/2020, 4:43 PM

## 2020-08-03 MED ORDER — COCONUT OIL OIL: 1.0000 "application " | TOPICAL_OIL | 0 refills | Status: AC | PRN

## 2020-08-03 MED ORDER — FERROUS SULFATE 325 (65 FE) MG PO TABS: 325.0000 mg | ORAL_TABLET | ORAL | 3 refills | Status: AC

## 2020-08-03 MED ORDER — IBUPROFEN 800 MG PO TABS: 800.0000 mg | ORAL_TABLET | Freq: Four times a day (QID) | ORAL | 0 refills | Status: AC

## 2020-08-03 MED ORDER — SENNOSIDES-DOCUSATE SODIUM 8.6-50 MG PO TABS: 2.0000 | ORAL_TABLET | ORAL | 0 refills | Status: AC

## 2020-08-03 MED ORDER — ACETAMINOPHEN 500 MG PO TABS: 1000.0000 mg | ORAL_TABLET | Freq: Three times a day (TID) | ORAL | 0 refills | Status: AC

## 2020-08-03 MED ORDER — OXYCODONE HCL 5 MG PO TABS: 5.0000 mg | ORAL_TABLET | ORAL | 0 refills | Status: AC | PRN

## 2020-08-03 NOTE — Lactation Note (Incomplete)
This note was copied from a baby's chart. Lactation Consultation Note  Patient Name: Girl Fallynn Gravett [id number] Date: 08/03/2020 Reason for consult: Follow-up assessment Age:35 hours  Maternal Data    Feeding    LATCH Score                    Lactation Tools Discussed/Used    Interventions Interventions: Breast feeding basics reviewed;Education  Discharge Discharge Education: Engorgement and breast care;Outpatient recommendation  Consult Status Consult Status: Complete Date: 08/03/20    Dahlia Byes [facility] 08/03/2020, 11:54 AM

## 2020-08-08 ENCOUNTER — Ambulatory Visit (INDEPENDENT_AMBULATORY_CARE_PROVIDER_SITE_OTHER): Payer: No Typology Code available for payment source | Admitting: *Deleted

## 2020-08-08 ENCOUNTER — Other Ambulatory Visit: Payer: Self-pay

## 2020-08-08 NOTE — Progress Notes (Signed)
Subjective:     Krista Russell is a 35 y.o. female who presents to the clinic for her incision check.    Objective:    BP (!) 151/96   Pulse 76  Incision:   healing well, no drainage, no erythema, no hernia, no seroma, no swelling, no dehiscence, incision well approximated     Assessment:    Doing well postoperatively. Pt did have elevated BP's in the office. Denies any HA, SOB, swelling, visions changes.    Plan:    1. Wound care discussed. 2. Dr Alvester Morin made aware of BP's, Dr Alvester Morin discussed with pt about checking BP's at home and pt will let us know via mychart what they are, and will treat if needed.  5. Follow up: at postpartum visit and or as needed.   Scheryl Marten, RN

## 2020-08-08 NOTE — Progress Notes (Signed)
Attestation of Attending Supervision of clinical support staff: I agree with the care provided to this patient and was available for any consultation.  I have reviewed the RN's note and chart. I was consulted due to BP  #Blood Pressure elevation Patient had normal BPs throughout pregnancy and postpartum.  She will check BP at home and report back Plan to start norvasc 5mg  if still > 140/90. RN can send in as a verbal order if blood pressures are elevated. Patient in agreement with plan  #Latch Has some nipple trauma on left, superior to nipple. Assisted in cross cradle latching and provided chin support and cheek support to improve depth of latch Reviewed home techniques to improve depth of latch Discussed repeat visit for any continued pain Patient will apply coconut oil to assist healing  , MD, MPH, ABFM Attending Physician Faculty Practice- Center for [facility]

## 2020-08-10 DIAGNOSIS — O165 Unspecified maternal hypertension, complicating the puerperium: Secondary | ICD-10-CM

## 2020-08-10 MED ORDER — AMLODIPINE BESYLATE 5 MG PO TABS: 5.0000 mg | ORAL_TABLET | Freq: Every day | ORAL | 3 refills | Status: AC

## 2020-09-05 ENCOUNTER — Ambulatory Visit (INDEPENDENT_AMBULATORY_CARE_PROVIDER_SITE_OTHER): Payer: PRIVATE HEALTH INSURANCE | Admitting: Family Medicine

## 2020-09-05 ENCOUNTER — Encounter: Payer: Self-pay | Admitting: Family Medicine

## 2020-09-05 ENCOUNTER — Other Ambulatory Visit: Payer: Self-pay

## 2020-09-05 NOTE — Progress Notes (Signed)
Post Partum Visit Note  Krista Russell is a 35 y.o. [phone] female who presents for a postpartum visit. She is 6 weeks postpartum following a primary cesarean section.  I have fully reviewed the prenatal and intrapartum course. The delivery was at [redacted]w[redacted]d gestational weeks.  Anesthesia: spinal. Postpartum course has been uncomplicated. Baby is doing well. Baby is feeding by breast. Bleeding no bleeding. Bowel function is normal. Bladder function is normal. Patient is not sexually active. Contraception method is none. Postpartum depression screening: negative.   The pregnancy intention screening data noted above was reviewed. Potential methods of contraception were discussed. The patient elected to proceed with No Method - Other Reason.    Edinburgh Postnatal Depression Scale - 09/05/20 1044      Edinburgh Postnatal Depression Scale:  In the Past 7 Days   I have been able to laugh and see the funny side of things. 0    I have looked forward with enjoyment to things. 0    I have blamed myself unnecessarily when things went wrong. 0    I have been anxious or worried for no good reason. 0    I have felt scared or panicky for no good reason. 0    Things have been getting on top of me. 1    I have been so unhappy that I have had difficulty sleeping. 0    I have felt sad or miserable. 0    I have been so unhappy that I have been crying. 0    The thought of harming myself has occurred to me. 0    Edinburgh Postnatal Depression Scale Total 1          The following portions of the patient's history were reviewed and updated as appropriate: allergies, current medications, past family history, past medical history, past social history, past surgical history and problem list.  Review of Systems Pertinent items are noted in HPI.  Objective:  BP 127/85   Pulse 93   Ht 5' 5.5" (1.664 m)   Wt 138 lb (62.6 kg)   Breastfeeding Yes   BMI 22.62 kg/m    General:  alert, cooperative and appears  stated age   Breasts:  inspection negative, no nipple discharge or bleeding, no masses or nodularity palpable  Lungs: clear to auscultation bilaterally  Heart:  regular rate and rhythm, S1, S2 normal, no murmur, click, rub or gallop  Abdomen: soft, non-tender; bowel sounds normal; no masses,  no organomegaly. Well healed scar   Vulva:  not evaluated  Vagina: not evaluated  Cervix:  not evlauated  Corpus: not examined  Adnexa:  not evaluated  Rectal Exam: Not performed.        Assessment:    Normal postpartum exam. Pap smear not done at today's visit.   Plan:   Essential components of care per ACOG recommendations:  1.  Mood and well being: Patient with negative depression screening today. Reviewed local resources for support.  - Patient does not use tobacco.  - hx of drug use? No    2. Infant care and feeding:  -Patient currently breastmilk feeding? Yes -- nursing is much improved. Krista Russell is a good nurser after use of the chin adjustment we did last session.  -Social determinants of health (SDOH) reviewed in EPIC. No concerns  3. Sexuality, contraception and birth spacing - Patient does not want a pregnancy in the next year.  Desired family size is 3 children.  - Reviewed forms of  contraception in tiered fashion. Patient desired natural family planning (NFP) today.   - Discussed birth spacing of 18 months  4. Sleep and fatigue -Encouraged family/partner/community support of 4 hrs of uninterrupted sleep to help with mood and fatigue  5. Physical Recovery  - Discussed patients delivery and complications - Patient has urinary incontinence? No  - Patient is safe to resume physical and sexual activity  6.  Health Maintenance - Does need pap -- not performed today  7. Chronic Disease - PCP follow up  Federico Flake, MD Center for [facility], [facility]

## 2020-11-11 IMAGING — US US MFM OB DETAIL+14 WK
2 series · 13 of 28 positions shown · non-contrast
Comparison: none

[Series 1: us mfm ob detail+14 wk · 11 of 71 slices shown (1 of 2)]
[im 4/71]
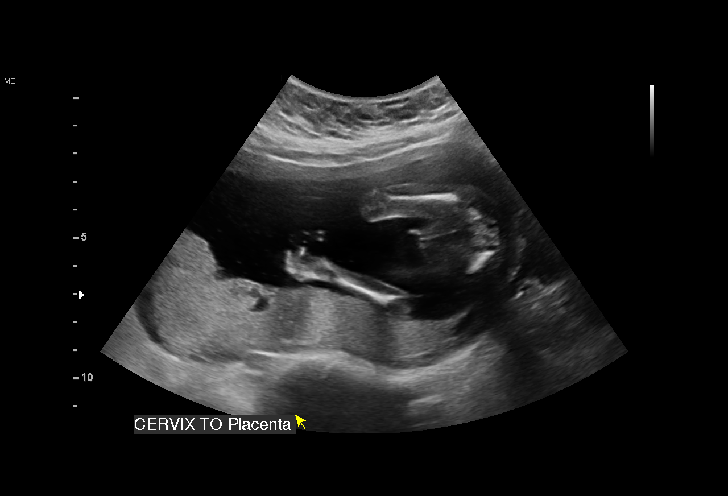
[im 10/71]
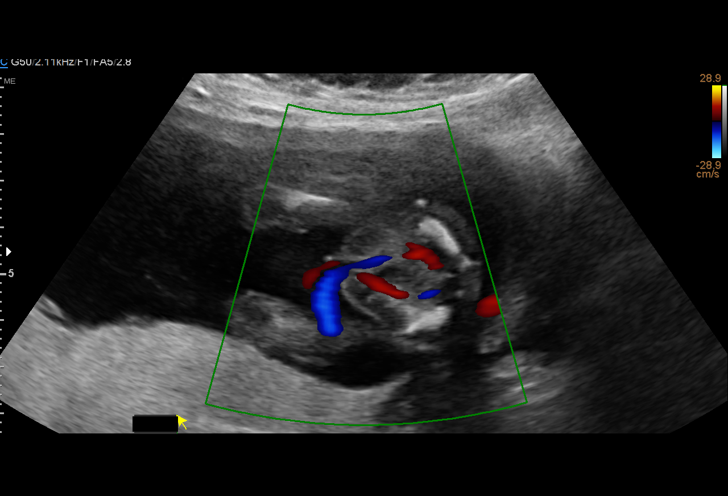
[im 16/71]
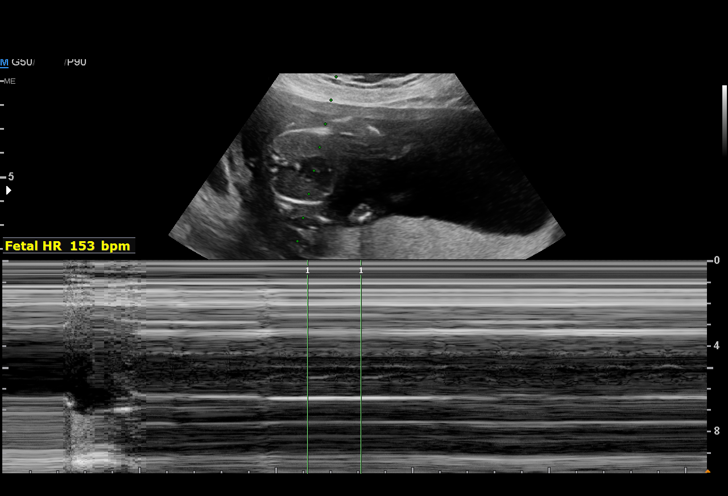
[im 23/71]
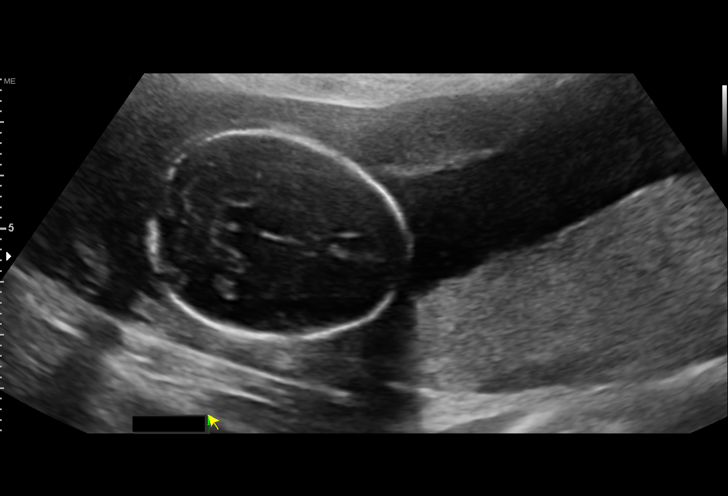
[im 29/71]
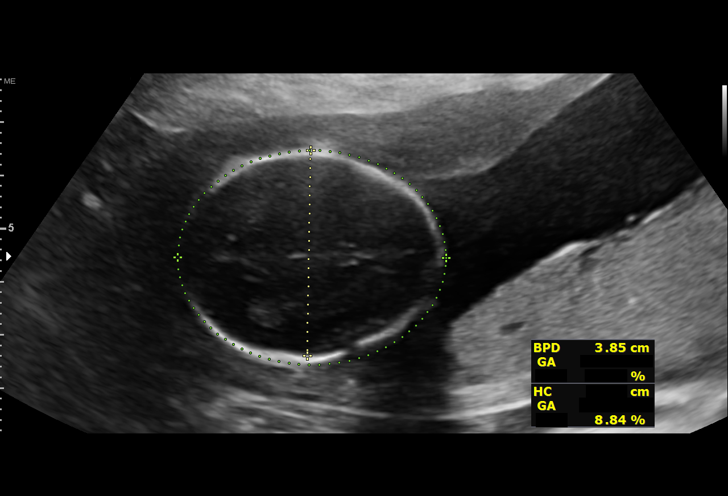
[im 36/71]
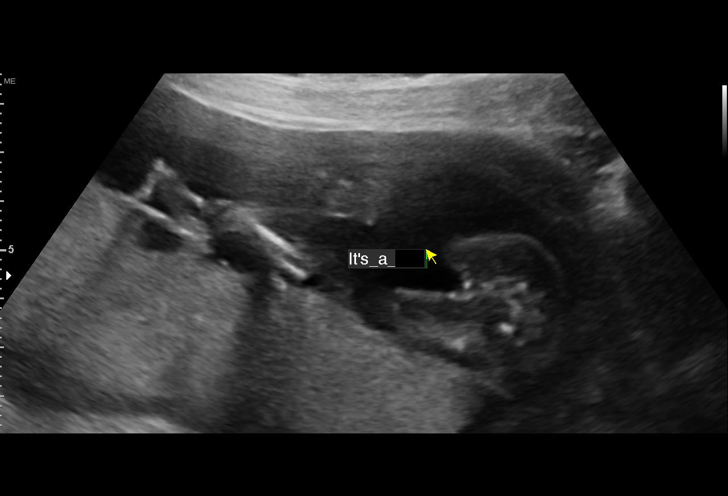
[im 45/71]
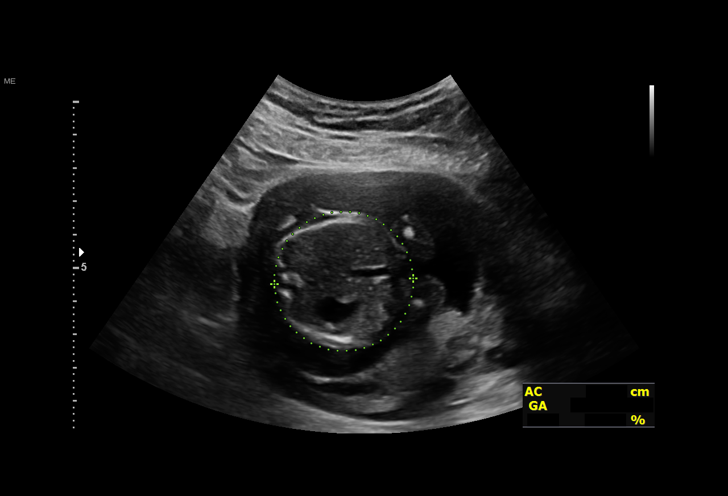
[im 51/71]
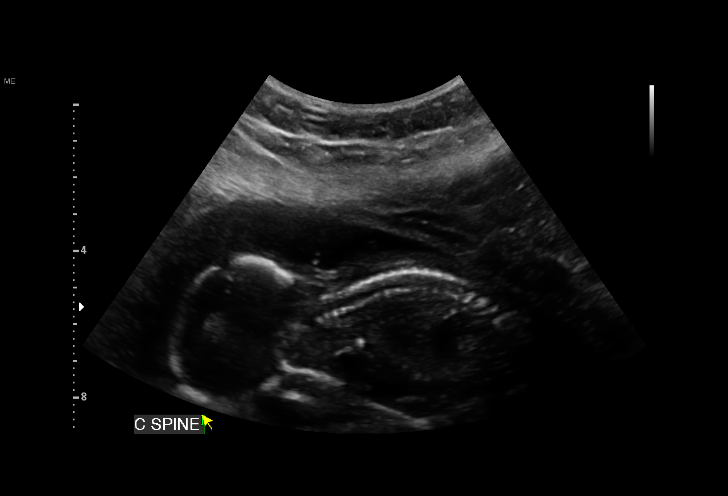
[im 58/71]
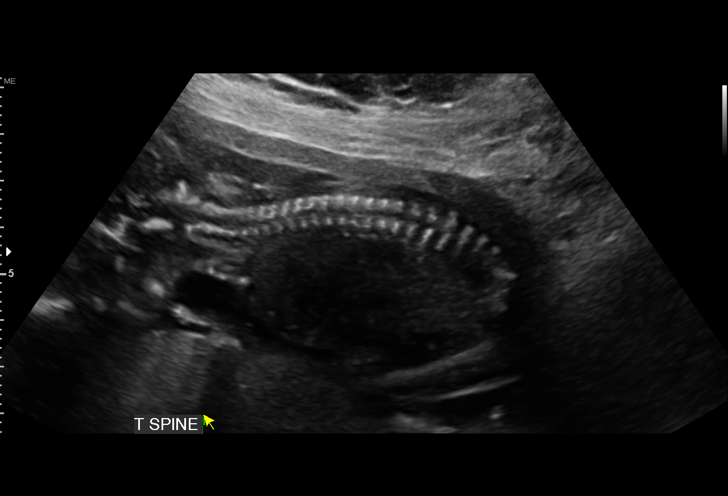
[im 64/71]
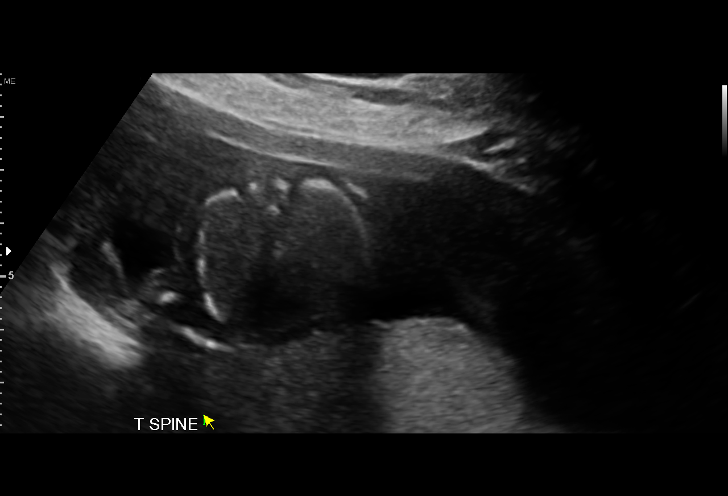
[im 71/71]
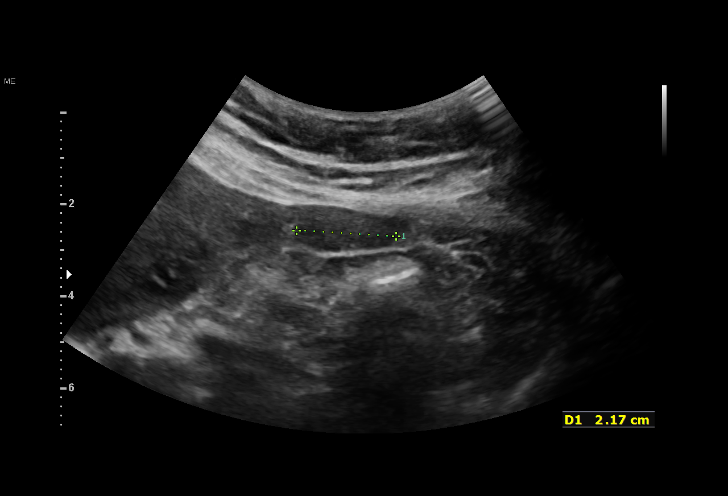

[Series 3: us mfm ob detail+14 wk · 2 of 15 slices shown (2 of 2)]
[im 4/15]
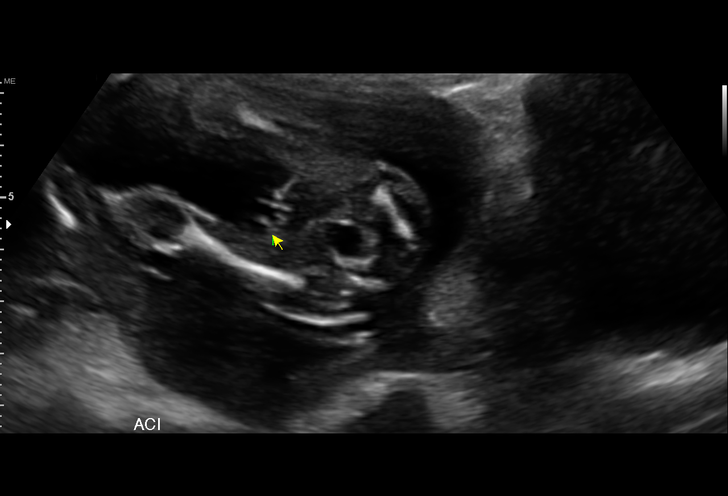
[im 11/15]
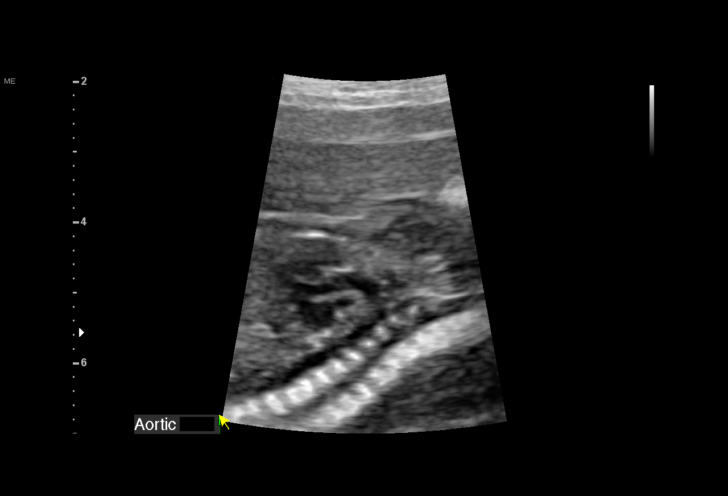

[13 of 28 positions shown; findings below may reference images not displayed]

Maternal [HOSPITAL]
                                                            at [REDACTED]

Indications

 18 weeks gestation of pregnancy
 Echogenic intracardiac focus of the heart
 (EIF)
Fetal Evaluation

 Num Of Fetuses:         1
 Fetal Heart Rate(bpm):  153
 Cardiac Activity:       Observed
 Presentation:           Breech
 Placenta:               Posterior
 P. Cord Insertion:      Visualized, central

                             Largest Pocket(cm)

Biometry

 BPD:      38.7  mm     G. Age:  17w 5d         23  %    CI:        74.06   %    70 - 86
                                                         FL/HC:      18.4   %    15.8 - 18
 HC:      142.8  mm     G. Age:  17w 4d          8  %    HC/AC:      1.11        1.07 -
 AC:      129.2  mm     G. Age:  18w 3d         48  %    FL/BPD:     68.0   %
 FL:       26.3  mm     G. Age:  18w 0d         28  %    FL/AC:      20.4   %    20 - 24
 CER:      17.1  mm     G. Age:  17w 2d          6  %
 NFT:       2.4  mm
 LV:          6  mm
 CM:        3.3  mm

 Est. FW:     227  gm      0 lb 8 oz     30  %
Gestational Age
 LMP:           18w 3d        Date:  10/31/19                 EDD:   08/06/20
 U/S Today:     18w 0d                                        EDD:   08/09/20
 Best:          18w 3d     Det. By:  LMP  (10/31/19)          EDD:   08/06/20
Anatomy

 Cranium:               Appears normal         Aortic Arch:            Appears normal
 Cavum:                 Appears normal         Ductal Arch:            Appears normal
 Ventricles:            Appears normal         Diaphragm:              Appears normal
 Choroid Plexus:        Appears normal         Stomach:                Appears normal, left
                                                                       sided
 Cerebellum:            Appears normal         Abdomen:                Appears normal
 Posterior Fossa:       Appears normal         Abdominal Wall:         Appears nml (cord
                                                                       insert, abd wall)
 Nuchal Fold:           Appears normal         Cord Vessels:           Appears normal (3
                                                                       vessel cord)
 Face:                  Appears normal         Kidneys:                Appear normal
                        (orbits and profile)
 Lips:                  Appears normal         Bladder:                Appears normal
 Thoracic:              Appears normal         Spine:                  Appears normal
 Heart:                 Appears normal; EIF    Upper Extremities:      Appears normal
 RVOT:                  Appears normal         Lower Extremities:      Appears normal
 LVOT:                  Appears normal
Cervix Uterus Adnexa

 Cervix
 Length:           4.56  cm.
Impression

 G6 P1. Patient is here for fetal anatomy scan. She had opted
 not to screen for fetal aneuploidies.
 Obstetric history is significant for a term vaginal delivery
 (forceps).

 We performed fetal anatomy scan. An echogenic intracardiac
 focus is seen. No other makers of aneuploidies or fetal
 structural defects are seen. Fetal biometry is consistent with
 her previously-established dates. Amniotic fluid is normal and
 good fetal activity is seen.
 Echogenic intracardiac focus: Echogenic intracardiac focus is
 seen in about 2% to 3% of normal fetuses (more in Asian
 population), and in about 15%-20% of fetuses with Down
 syndrome. She was reassured that echogenic focus is not
 associated with any structural heart malformations. Presence
 of this isolated marker only slightly increases the a priori risk
 for Down syndrome.

 I discussed the following options: 1) Maternal blood cell-free
 fetal DNA screening  for trisomies 21, 18 and 13, which has a
 greater detection rate than conventional screening tests. I
 informed the patient that not all chromosomal malformations
 are detected by this test. 2) I informed her that only
 amniocentesis will give a definitive result on the fetal
 karyotype. I discussed a procedure-related pregnancy loss of
 about 1 in 500.

 Patient opted not to have amniocentesis.  She will discuss
 cell-free fetal DNA screening with you at her prenatal visit
 next week.
Recommendations

 Follow-up scans as clinically indicated.
                 Jingkai, Ricardocarmen
